# Patient Record
Sex: Male | Born: 1955
Health system: Southern US, Community
[De-identification: ages and names within clinical notes are randomized; demographics above are authoritative.]

## PROBLEM LIST (undated history)

## (undated) ENCOUNTER — Emergency Department (HOSPITAL_COMMUNITY): Admission: EM | Payer: Medicare Other | Source: Home / Self Care

## (undated) DIAGNOSIS — E119 Type 2 diabetes mellitus without complications: Secondary | ICD-10-CM

## (undated) DIAGNOSIS — I10 Essential (primary) hypertension: Secondary | ICD-10-CM

## (undated) DIAGNOSIS — W11XXXA Fall on and from ladder, initial encounter: Secondary | ICD-10-CM

## (undated) DIAGNOSIS — K219 Gastro-esophageal reflux disease without esophagitis: Secondary | ICD-10-CM

## (undated) DIAGNOSIS — L309 Dermatitis, unspecified: Secondary | ICD-10-CM

## (undated) DIAGNOSIS — E7800 Pure hypercholesterolemia, unspecified: Secondary | ICD-10-CM

## (undated) DIAGNOSIS — M1990 Unspecified osteoarthritis, unspecified site: Secondary | ICD-10-CM

## (undated) HISTORY — PX: APPENDECTOMY: SHX54

## (undated) HISTORY — DX: Dermatitis, unspecified: L30.9

---

## 2014-06-20 DIAGNOSIS — Z125 Encounter for screening for malignant neoplasm of prostate: Secondary | ICD-10-CM | POA: Diagnosis not present

## 2014-06-28 DIAGNOSIS — N529 Male erectile dysfunction, unspecified: Secondary | ICD-10-CM | POA: Diagnosis not present

## 2015-01-29 DIAGNOSIS — Z9114 Patient's other noncompliance with medication regimen: Secondary | ICD-10-CM | POA: Diagnosis not present

## 2015-01-29 DIAGNOSIS — E7800 Pure hypercholesterolemia, unspecified: Secondary | ICD-10-CM | POA: Diagnosis not present

## 2015-01-29 DIAGNOSIS — R112 Nausea with vomiting, unspecified: Secondary | ICD-10-CM | POA: Diagnosis not present

## 2015-01-29 DIAGNOSIS — R531 Weakness: Secondary | ICD-10-CM | POA: Diagnosis not present

## 2015-01-29 DIAGNOSIS — E119 Type 2 diabetes mellitus without complications: Secondary | ICD-10-CM | POA: Diagnosis not present

## 2015-01-29 DIAGNOSIS — K219 Gastro-esophageal reflux disease without esophagitis: Secondary | ICD-10-CM | POA: Diagnosis not present

## 2015-01-29 DIAGNOSIS — F17200 Nicotine dependence, unspecified, uncomplicated: Secondary | ICD-10-CM | POA: Diagnosis not present

## 2015-01-29 DIAGNOSIS — R748 Abnormal levels of other serum enzymes: Secondary | ICD-10-CM | POA: Diagnosis not present

## 2015-01-29 DIAGNOSIS — I10 Essential (primary) hypertension: Secondary | ICD-10-CM | POA: Diagnosis not present

## 2015-01-29 DIAGNOSIS — R42 Dizziness and giddiness: Secondary | ICD-10-CM | POA: Diagnosis not present

## 2015-02-17 DIAGNOSIS — F418 Other specified anxiety disorders: Secondary | ICD-10-CM | POA: Diagnosis not present

## 2015-02-28 DIAGNOSIS — F418 Other specified anxiety disorders: Secondary | ICD-10-CM | POA: Diagnosis not present

## 2015-03-17 DIAGNOSIS — F418 Other specified anxiety disorders: Secondary | ICD-10-CM | POA: Diagnosis not present

## 2015-04-01 DIAGNOSIS — R093 Abnormal sputum: Secondary | ICD-10-CM | POA: Diagnosis not present

## 2015-04-01 DIAGNOSIS — J111 Influenza due to unidentified influenza virus with other respiratory manifestations: Secondary | ICD-10-CM | POA: Diagnosis not present

## 2015-04-01 DIAGNOSIS — I10 Essential (primary) hypertension: Secondary | ICD-10-CM | POA: Diagnosis not present

## 2015-04-01 DIAGNOSIS — R0981 Nasal congestion: Secondary | ICD-10-CM | POA: Diagnosis not present

## 2015-04-01 DIAGNOSIS — F17200 Nicotine dependence, unspecified, uncomplicated: Secondary | ICD-10-CM | POA: Diagnosis not present

## 2015-04-01 DIAGNOSIS — R197 Diarrhea, unspecified: Secondary | ICD-10-CM | POA: Diagnosis not present

## 2015-04-01 DIAGNOSIS — E7800 Pure hypercholesterolemia, unspecified: Secondary | ICD-10-CM | POA: Diagnosis not present

## 2015-04-01 DIAGNOSIS — R05 Cough: Secondary | ICD-10-CM | POA: Diagnosis not present

## 2015-04-01 DIAGNOSIS — E119 Type 2 diabetes mellitus without complications: Secondary | ICD-10-CM | POA: Diagnosis not present

## 2015-04-14 DIAGNOSIS — F418 Other specified anxiety disorders: Secondary | ICD-10-CM | POA: Diagnosis not present

## 2015-08-26 DIAGNOSIS — F418 Other specified anxiety disorders: Secondary | ICD-10-CM | POA: Diagnosis not present

## 2015-10-16 DIAGNOSIS — F418 Other specified anxiety disorders: Secondary | ICD-10-CM | POA: Diagnosis not present

## 2015-11-05 DIAGNOSIS — F418 Other specified anxiety disorders: Secondary | ICD-10-CM | POA: Diagnosis not present

## 2016-05-10 ENCOUNTER — Emergency Department (HOSPITAL_BASED_OUTPATIENT_CLINIC_OR_DEPARTMENT_OTHER)
Admission: EM | Admit: 2016-05-10 | Discharge: 2016-05-10 | Disposition: A | Discharge status: Home / Self Care | Payer: Medicare Other | Attending: Emergency Medicine | Admitting: Emergency Medicine

## 2016-05-10 ENCOUNTER — Encounter (HOSPITAL_BASED_OUTPATIENT_CLINIC_OR_DEPARTMENT_OTHER): Payer: Self-pay | Admitting: Emergency Medicine

## 2016-05-10 DIAGNOSIS — E119 Type 2 diabetes mellitus without complications: Secondary | ICD-10-CM | POA: Diagnosis not present

## 2016-05-10 DIAGNOSIS — F17200 Nicotine dependence, unspecified, uncomplicated: Secondary | ICD-10-CM | POA: Insufficient documentation

## 2016-05-10 DIAGNOSIS — Z7984 Long term (current) use of oral hypoglycemic drugs: Secondary | ICD-10-CM | POA: Diagnosis not present

## 2016-05-10 DIAGNOSIS — R21 Rash and other nonspecific skin eruption: Secondary | ICD-10-CM | POA: Diagnosis not present

## 2016-05-10 HISTORY — DX: Type 2 diabetes mellitus without complications: E11.9

## 2016-05-10 MED ORDER — CEPHALEXIN 500 MG PO CAPS: 500.0000 mg | ORAL_CAPSULE | Freq: Two times a day (BID) | ORAL | 0 refills | Status: AC

## 2016-05-10 NOTE — ED Triage Notes (Signed)
Pt states he ws in his yard last night and felt something pinch the back of his right ear.  Pt states he thought it was a mosquito.  Pt woke up this am, he noticed small swollen area behind his ear.  Pt states he squeezed it and black substance came out.  Denies fever.

## 2016-05-10 NOTE — Discharge Instructions (Signed)
We do not know specifically what is causing your rash, but we are treating this with antibiotics.  Please take any prescribed medications as written and continue taking your normal prescription medications unless otherwise specified.    Follow up with the recommended physicians and return to the emergency department with any new or worsening symptoms that concern you, including but not limited to fever, lesions inside your mouth, etc.  Discuss the rash with your PCP to decide if any additional testing is needed after abx.

## 2016-05-10 NOTE — ED Provider Notes (Signed)
Emergency Department Provider Note  By signing my name below, I, Rosana Fretana Waskiewicz, attest that this documentation has been prepared under the direction and in the presence of Long, Arlyss RepressJoshua G, MD. Electronically Signed: Rosana Fretana Waskiewicz, ED Scribe. 05/10/16. 6:02 PM.  I have reviewed the triage vital signs and the nursing notes.   HISTORY  Chief Complaint Abscess   HPI HPI Comments: Levi Simpson is a 61 y.o. male with a PMHx of DM, who presents to the Emergency Department complaining of a moderate, gradually worsening bump behind right ear onset last night. Pt suspects an insect bite to be the cause of the bump. No known treatments were tried prior to arrival in the ED. The pt reports associated swelling to the area, and right-sided facial tingling.  He is currently taking Metformin and has no complaints DM at this time. Pt denies fever, drainage from the area or other associated symptoms.   Past Medical History:  Diagnosis Date  . Diabetes mellitus without complication (HCC)     There are no active problems to display for this patient.   No past surgical history on file.  Current Outpatient Rx  . Order #: [id number] Class: Historical Med  . Order #: [id number] Class: Print    Allergies Patient has no known allergies.  No family history on file.  Social History Social History  Substance Use Topics  . Smoking status: Current Every Day Smoker  . Smokeless tobacco: Never Used  . Alcohol use Not on file    Review of Systems Constitutional: No fever/chills Eyes: No visual changes. ENT: No sore throat. Cardiovascular: Denies chest pain. Respiratory: Denies shortness of breath. Gastrointestinal: No abdominal pain.  No nausea, no vomiting.  No diarrhea.  No constipation. Genitourinary: Negative for dysuria. Musculoskeletal: Negative for back pain. Skin: negative for rash +bump Neurological: Negative for headaches, focal weakness +right-sided facial tingling  10-point ROS  otherwise negative.  ____________________________________________   PHYSICAL EXAM:  VITAL SIGNS: ED Triage Vitals  Enc Vitals Group     BP 05/10/16 1715 (!) 139/92     Pulse Rate 05/10/16 1715 76     Resp 05/10/16 1715 18     Temp 05/10/16 1715 98.3 F (36.8 C)     Temp Source 05/10/16 1715 Oral     SpO2 05/10/16 1715 100 %     Weight 05/10/16 1709 172 lb (78 kg)     Height 05/10/16 1709 5\' 8"  (1.727 m)     Pain Score 05/10/16 1710 8   Constitutional: Alert and oriented. Well appearing and in no acute distress. Eyes: Conjunctivae are normal.  Head: Atraumatic. Nose: No congestion/rhinnorhea. Mouth/Throat: Mucous membranes are moist.   Neck: No stridor.  Cardiovascular: Normal rate, regular rhythm. Good peripheral circulation. Grossly normal heart sounds.   Respiratory: Normal respiratory effort.  No retractions. Lungs CTAB. Gastrointestinal: Soft and nontender. No distention.  Musculoskeletal: No lower extremity tenderness nor edema. No gross deformities of extremities. Neurologic:  Normal speech and language. No gross focal neurologic deficits are appreciated.  Skin:  Skin is warm and dry. Oozing, slightly erythematous rash behind right ear. No fluctuance or masses.    ____________________________________________   PROCEDURES  Procedure(s) performed:   Procedures  None ____________________________________________   INITIAL IMPRESSION / ASSESSMENT AND PLAN / ED COURSE  Pertinent labs & imaging results that were available during my care of the patient were reviewed by me and considered in my medical decision making (see chart for details).  Patient presents to the ED for  evaluation of rash behind the right ear. Consistent with mild cellulitis. Will cover with Keflex. No abscess for I&D. Advised to f/u with PCP after abx to ensure area is gone. May need Dermatology follow up if lesion continues for biopsy. Discussed this plan and impression with the patient in  detail.    ____________________________________________  FINAL CLINICAL IMPRESSION(S) / ED DIAGNOSES  Final diagnoses:  Rash     MEDICATIONS GIVEN DURING THIS VISIT:  Medications - No data to display   NEW OUTPATIENT MEDICATIONS STARTED DURING THIS VISIT:  Discharge Medication List as of 05/10/2016  6:06 PM    START taking these medications   Details  cephALEXin (KEFLEX) 500 MG capsule Take 1 capsule (500 mg total) by mouth 2 (two) times daily., Starting Mon 05/10/2016, Until Mon 05/17/2016, Print        Note:  This document was prepared using Dragon voice recognition software and may include unintentional dictation errors.  Alona Bene, MD Emergency Medicine  I personally performed the services described in this documentation, which was scribed in my presence. The recorded information has been reviewed and is accurate.        Maia Plan, MD 05/11/16 1140

## 2016-06-07 ENCOUNTER — Encounter (HOSPITAL_COMMUNITY): Payer: Self-pay | Admitting: Emergency Medicine

## 2016-06-07 ENCOUNTER — Inpatient Hospital Stay (HOSPITAL_COMMUNITY)
Admission: EM | Admit: 2016-06-07 | Discharge: 2016-06-09 | DRG: 183 | Disposition: A | Discharge status: Home / Self Care | Payer: Medicare Other | Attending: General Surgery | Admitting: General Surgery

## 2016-06-07 ENCOUNTER — Emergency Department (HOSPITAL_COMMUNITY): Payer: Medicare Other

## 2016-06-07 DIAGNOSIS — M170 Bilateral primary osteoarthritis of knee: Secondary | ICD-10-CM | POA: Diagnosis present

## 2016-06-07 DIAGNOSIS — S22089A Unspecified fracture of T11-T12 vertebra, initial encounter for closed fracture: Secondary | ICD-10-CM | POA: Diagnosis present

## 2016-06-07 DIAGNOSIS — M19042 Primary osteoarthritis, left hand: Secondary | ICD-10-CM | POA: Diagnosis present

## 2016-06-07 DIAGNOSIS — E119 Type 2 diabetes mellitus without complications: Secondary | ICD-10-CM | POA: Diagnosis present

## 2016-06-07 DIAGNOSIS — S32039A Unspecified fracture of third lumbar vertebra, initial encounter for closed fracture: Secondary | ICD-10-CM | POA: Diagnosis present

## 2016-06-07 DIAGNOSIS — S301XXA Contusion of abdominal wall, initial encounter: Secondary | ICD-10-CM | POA: Diagnosis present

## 2016-06-07 DIAGNOSIS — S32029A Unspecified fracture of second lumbar vertebra, initial encounter for closed fracture: Secondary | ICD-10-CM | POA: Diagnosis present

## 2016-06-07 DIAGNOSIS — I10 Essential (primary) hypertension: Secondary | ICD-10-CM | POA: Diagnosis present

## 2016-06-07 DIAGNOSIS — S3993XA Unspecified injury of pelvis, initial encounter: Secondary | ICD-10-CM | POA: Diagnosis not present

## 2016-06-07 DIAGNOSIS — W11XXXA Fall on and from ladder, initial encounter: Secondary | ICD-10-CM | POA: Diagnosis not present

## 2016-06-07 DIAGNOSIS — G4489 Other headache syndrome: Secondary | ICD-10-CM | POA: Diagnosis not present

## 2016-06-07 DIAGNOSIS — E7800 Pure hypercholesterolemia, unspecified: Secondary | ICD-10-CM | POA: Diagnosis present

## 2016-06-07 DIAGNOSIS — Z23 Encounter for immunization: Secondary | ICD-10-CM | POA: Diagnosis not present

## 2016-06-07 DIAGNOSIS — S0990XA Unspecified injury of head, initial encounter: Secondary | ICD-10-CM | POA: Diagnosis not present

## 2016-06-07 DIAGNOSIS — S79912A Unspecified injury of left hip, initial encounter: Secondary | ICD-10-CM | POA: Diagnosis not present

## 2016-06-07 DIAGNOSIS — K219 Gastro-esophageal reflux disease without esophagitis: Secondary | ICD-10-CM | POA: Diagnosis present

## 2016-06-07 DIAGNOSIS — S22008A Other fracture of unspecified thoracic vertebra, initial encounter for closed fracture: Secondary | ICD-10-CM

## 2016-06-07 DIAGNOSIS — Z9114 Patient's other noncompliance with medication regimen: Secondary | ICD-10-CM

## 2016-06-07 DIAGNOSIS — F17210 Nicotine dependence, cigarettes, uncomplicated: Secondary | ICD-10-CM | POA: Diagnosis present

## 2016-06-07 DIAGNOSIS — Z7984 Long term (current) use of oral hypoglycemic drugs: Secondary | ICD-10-CM | POA: Diagnosis not present

## 2016-06-07 DIAGNOSIS — S8992XA Unspecified injury of left lower leg, initial encounter: Secondary | ICD-10-CM | POA: Diagnosis not present

## 2016-06-07 DIAGNOSIS — M19041 Primary osteoarthritis, right hand: Secondary | ICD-10-CM | POA: Diagnosis present

## 2016-06-07 DIAGNOSIS — S2242XA Multiple fractures of ribs, left side, initial encounter for closed fracture: Principal | ICD-10-CM | POA: Diagnosis present

## 2016-06-07 DIAGNOSIS — R109 Unspecified abdominal pain: Secondary | ICD-10-CM | POA: Diagnosis not present

## 2016-06-07 DIAGNOSIS — S32049A Unspecified fracture of fourth lumbar vertebra, initial encounter for closed fracture: Secondary | ICD-10-CM | POA: Diagnosis present

## 2016-06-07 DIAGNOSIS — S3991XA Unspecified injury of abdomen, initial encounter: Secondary | ICD-10-CM | POA: Diagnosis not present

## 2016-06-07 DIAGNOSIS — S299XXA Unspecified injury of thorax, initial encounter: Secondary | ICD-10-CM | POA: Diagnosis not present

## 2016-06-07 DIAGNOSIS — S32019A Unspecified fracture of first lumbar vertebra, initial encounter for closed fracture: Secondary | ICD-10-CM | POA: Diagnosis present

## 2016-06-07 DIAGNOSIS — K661 Hemoperitoneum: Secondary | ICD-10-CM | POA: Diagnosis present

## 2016-06-07 DIAGNOSIS — J9811 Atelectasis: Secondary | ICD-10-CM | POA: Diagnosis not present

## 2016-06-07 DIAGNOSIS — M1990 Unspecified osteoarthritis, unspecified site: Secondary | ICD-10-CM | POA: Diagnosis present

## 2016-06-07 DIAGNOSIS — M542 Cervicalgia: Secondary | ICD-10-CM | POA: Diagnosis not present

## 2016-06-07 HISTORY — DX: Fall on and from ladder, initial encounter: W11.XXXA

## 2016-06-07 HISTORY — DX: Pure hypercholesterolemia, unspecified: E78.00

## 2016-06-07 HISTORY — DX: Gastro-esophageal reflux disease without esophagitis: K21.9

## 2016-06-07 HISTORY — DX: Essential (primary) hypertension: I10

## 2016-06-07 HISTORY — DX: Type 2 diabetes mellitus without complications: E11.9

## 2016-06-07 HISTORY — DX: Unspecified osteoarthritis, unspecified site: M19.90

## 2016-06-07 LAB — COMPREHENSIVE METABOLIC PANEL
ALBUMIN: 3.9 g/dL (ref 3.5–5.0)
ALK PHOS: 58 U/L (ref 38–126)
ALT: 24 U/L (ref 17–63)
AST: 33 U/L (ref 15–41)
Anion gap: 9 (ref 5–15)
BUN: 11 mg/dL (ref 6–20)
CALCIUM: 9 mg/dL (ref 8.9–10.3)
CO2: 23 mmol/L (ref 22–32)
Chloride: 105 mmol/L (ref 101–111)
Creatinine, Ser: 0.89 mg/dL (ref 0.61–1.24)
GFR calc Af Amer: >60 mL/min (ref >60)
GFR calc non Af Amer: >60 mL/min (ref >60)
GLUCOSE: 101 mg/dL — AB (ref 65–99)
Potassium: 3.9 mmol/L (ref 3.5–5.1)
Sodium: 137 mmol/L (ref 135–145)
TOTAL PROTEIN: 6.4 g/dL — AB (ref 6.5–8.1)
Total Bilirubin: 0.8 mg/dL (ref 0.3–1.2)

## 2016-06-07 LAB — CBC
HCT: 40.9 % (ref 39.0–52.0)
HEMOGLOBIN: 13.5 g/dL (ref 13.0–17.0)
MCH: 28.2 pg (ref 26.0–34.0)
MCHC: 33 g/dL (ref 30.0–36.0)
MCV: 85.6 fL (ref 78.0–100.0)
Platelets: 187 10*3/uL (ref 150–400)
RBC: 4.78 MIL/uL (ref 4.22–5.81)
RDW: 13.1 % (ref 11.5–15.5)
WBC: 6.3 10*3/uL (ref 4.0–10.5)

## 2016-06-07 LAB — GLUCOSE, CAPILLARY
GLUCOSE-CAPILLARY: 89 mg/dL (ref 65–99)
Glucose-Capillary: 123 mg/dL — ABNORMAL HIGH (ref 65–99)

## 2016-06-07 LAB — I-STAT CHEM 8, ED
BUN: 11 mg/dL (ref 6–20)
CHLORIDE: 103 mmol/L (ref 101–111)
Calcium, Ion: 1.19 mmol/L (ref 1.15–1.40)
Creatinine, Ser: 0.9 mg/dL (ref 0.61–1.24)
Glucose, Bld: 104 mg/dL — ABNORMAL HIGH (ref 65–99)
HEMATOCRIT: 42 % (ref 39.0–52.0)
Hemoglobin: 14.3 g/dL (ref 13.0–17.0)
POTASSIUM: 3.9 mmol/L (ref 3.5–5.1)
SODIUM: 139 mmol/L (ref 135–145)
TCO2: 24 mmol/L (ref 0–100)

## 2016-06-07 LAB — URINALYSIS, ROUTINE W REFLEX MICROSCOPIC
BILIRUBIN URINE: NEGATIVE
Glucose, UA: NEGATIVE mg/dL
HGB URINE DIPSTICK: NEGATIVE
Ketones, ur: 5 mg/dL — AB
Leukocytes, UA: NEGATIVE
Nitrite: NEGATIVE
PROTEIN: NEGATIVE mg/dL
pH: 5 (ref 5.0–8.0)

## 2016-06-07 LAB — SAMPLE TO BLOOD BANK

## 2016-06-07 LAB — CDS SEROLOGY

## 2016-06-07 LAB — PROTIME-INR
INR: 0.99
PROTHROMBIN TIME: 13.1 s (ref 11.4–15.2)

## 2016-06-07 LAB — ETHANOL: ALCOHOL ETHYL (B): 6 mg/dL — AB (ref <5)

## 2016-06-07 MED ORDER — HYDROMORPHONE HCL 1 MG/ML IJ SOLN
INTRAMUSCULAR | Status: AC
  Administered 2016-06-07: 1 mg via INTRAVENOUS
  Filled 2016-06-07: qty 1

## 2016-06-07 MED ORDER — HYDRALAZINE HCL 20 MG/ML IJ SOLN: 10.0000 mg | Freq: Four times a day (QID) | INTRAMUSCULAR | Status: DC | PRN

## 2016-06-07 MED ORDER — DOCUSATE SODIUM 100 MG PO CAPS
100.0000 mg | ORAL_CAPSULE | Freq: Two times a day (BID) | ORAL | Status: DC
  Administered 2016-06-07 – 2016-06-09 (×3): 100 mg via ORAL
  Filled 2016-06-07 (×3): qty 1

## 2016-06-07 MED ORDER — ENOXAPARIN SODIUM 40 MG/0.4ML ~~LOC~~ SOLN
40.0000 mg | SUBCUTANEOUS | Status: DC
  Administered 2016-06-08 – 2016-06-09 (×2): 40 mg via SUBCUTANEOUS
  Filled 2016-06-07 (×2): qty 0.4

## 2016-06-07 MED ORDER — ONDANSETRON HCL 4 MG PO TABS: 4.0000 mg | ORAL_TABLET | Freq: Four times a day (QID) | ORAL | Status: DC | PRN

## 2016-06-07 MED ORDER — HYDROMORPHONE HCL 1 MG/ML IJ SOLN: 1.0000 mg | INTRAMUSCULAR | Status: DC | PRN

## 2016-06-07 MED ORDER — ONDANSETRON HCL 4 MG/2ML IJ SOLN: 4.0000 mg | Freq: Four times a day (QID) | INTRAMUSCULAR | Status: DC | PRN

## 2016-06-07 MED ORDER — ACETAMINOPHEN 325 MG PO TABS: 650.0000 mg | ORAL_TABLET | ORAL | Status: DC | PRN

## 2016-06-07 MED ORDER — OXYCODONE HCL 5 MG PO TABS
5.0000 mg | ORAL_TABLET | ORAL | Status: DC | PRN
  Administered 2016-06-07: 5 mg via ORAL
  Administered 2016-06-08 – 2016-06-09 (×5): 15 mg via ORAL
  Filled 2016-06-07 (×3): qty 3
  Filled 2016-06-07: qty 1
  Filled 2016-06-07 (×2): qty 3

## 2016-06-07 MED ORDER — SODIUM CHLORIDE 0.9 % IV SOLN: 250.0000 mL | INTRAVENOUS | Status: DC | PRN

## 2016-06-07 MED ORDER — INSULIN ASPART 100 UNIT/ML ~~LOC~~ SOLN: 0.0000 [IU] | Freq: Every day | SUBCUTANEOUS | Status: DC

## 2016-06-07 MED ORDER — PNEUMOCOCCAL VAC POLYVALENT 25 MCG/0.5ML IJ INJ
0.5000 mL | INJECTION | INTRAMUSCULAR | Status: AC
  Administered 2016-06-09: 0.5 mL via INTRAMUSCULAR
  Filled 2016-06-07: qty 0.5

## 2016-06-07 MED ORDER — IOPAMIDOL (ISOVUE-300) INJECTION 61%
INTRAVENOUS | Status: AC
  Administered 2016-06-07: 100 mL
  Filled 2016-06-07: qty 100

## 2016-06-07 MED ORDER — SODIUM CHLORIDE 0.9% FLUSH: 3.0000 mL | INTRAVENOUS | Status: DC | PRN

## 2016-06-07 MED ORDER — HYDROMORPHONE HCL 1 MG/ML IJ SOLN
1.0000 mg | INTRAMUSCULAR | Status: DC | PRN
  Administered 2016-06-07 (×2): 1 mg via INTRAVENOUS
  Filled 2016-06-07: qty 1

## 2016-06-07 MED ORDER — SODIUM CHLORIDE 0.9% FLUSH
3.0000 mL | Freq: Two times a day (BID) | INTRAVENOUS | Status: DC
  Administered 2016-06-07 – 2016-06-08 (×2): 3 mL via INTRAVENOUS

## 2016-06-07 MED ORDER — TETANUS-DIPHTH-ACELL PERTUSSIS 5-2.5-18.5 LF-MCG/0.5 IM SUSP
0.5000 mL | Freq: Once | INTRAMUSCULAR | Status: AC
  Administered 2016-06-07: 0.5 mL via INTRAMUSCULAR
  Filled 2016-06-07: qty 0.5

## 2016-06-07 MED ORDER — METHOCARBAMOL 1000 MG/10ML IJ SOLN
1000.0000 mg | Freq: Three times a day (TID) | INTRAVENOUS | Status: DC | PRN
  Filled 2016-06-07 (×2): qty 10

## 2016-06-07 MED ORDER — INSULIN ASPART 100 UNIT/ML ~~LOC~~ SOLN
0.0000 [IU] | Freq: Three times a day (TID) | SUBCUTANEOUS | Status: DC
  Administered 2016-06-09: 2 [IU] via SUBCUTANEOUS

## 2016-06-07 NOTE — ED Triage Notes (Signed)
Pt arrives from home via [facility] reporting fall from top of 24 ft ladder in tree.  Pt reports LOC, denies CP or SOB. Pt reports ambulating s/p fall, reports generalized L sided pain. Lacs noted to LLE, LUE, bleeding controlled at this time. Pt AOX4.

## 2016-06-07 NOTE — ED Notes (Signed)
Patient is stable and ready to be transport to the floor at this time.  Report was called to [facility] Charlie RN.  Belongings taken with the patient to the floor.
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
## 2016-06-07 NOTE — Progress Notes (Signed)
Orthopedic Tech Progress Note Patient Details:  Levi Simpson 12/30/1955 [id number]  Patient ID: Levi ReeJimmy Simpson, male   DOB: 12/30/1955, 61 y.o.   MRN: [id number]   Levi Simpson, Levi Simpson 06/07/2016, 10:56 AM Made level 2 trauma visit

## 2016-06-07 NOTE — Progress Notes (Signed)
Orthopedic Tech Progress Note Patient Details:  Levi ReeJimmy Vallecillo 11-13-1955 [id number]  Patient ID: Levi Simpson, male   DOB: 11-13-1955, 61 y.o.   MRN: [id number]   Levi Simpson, Levi Simpson 06/07/2016, 10:57 AM Delete one ortho visit
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
## 2016-06-08 ENCOUNTER — Inpatient Hospital Stay (HOSPITAL_COMMUNITY): Payer: Medicare Other

## 2016-06-08 LAB — CBC
HCT: 41.6 % (ref 39.0–52.0)
HEMOGLOBIN: 13.6 g/dL (ref 13.0–17.0)
MCH: 28.1 pg (ref 26.0–34.0)
MCHC: 32.7 g/dL (ref 30.0–36.0)
MCV: 86 fL (ref 78.0–100.0)
Platelets: 206 10*3/uL (ref 150–400)
RBC: 4.84 MIL/uL (ref 4.22–5.81)
RDW: 13.4 % (ref 11.5–15.5)
WBC: 7.8 10*3/uL (ref 4.0–10.5)

## 2016-06-08 LAB — BASIC METABOLIC PANEL
ANION GAP: 8 (ref 5–15)
BUN: 7 mg/dL (ref 6–20)
CHLORIDE: 102 mmol/L (ref 101–111)
CO2: 27 mmol/L (ref 22–32)
Calcium: 9.1 mg/dL (ref 8.9–10.3)
Creatinine, Ser: 0.93 mg/dL (ref 0.61–1.24)
GFR calc non Af Amer: >60 mL/min (ref >60)
Glucose, Bld: 107 mg/dL — ABNORMAL HIGH (ref 65–99)
POTASSIUM: 3.8 mmol/L (ref 3.5–5.1)
SODIUM: 137 mmol/L (ref 135–145)

## 2016-06-08 LAB — HIV ANTIBODY (ROUTINE TESTING W REFLEX): HIV SCREEN 4TH GENERATION: NONREACTIVE

## 2016-06-08 LAB — GLUCOSE, CAPILLARY
GLUCOSE-CAPILLARY: 99 mg/dL (ref 65–99)
GLUCOSE-CAPILLARY: 113 mg/dL — AB (ref 65–99)
GLUCOSE-CAPILLARY: 111 mg/dL — AB (ref 65–99)
GLUCOSE-CAPILLARY: 119 mg/dL — AB (ref 65–99)

## 2016-06-08 NOTE — Evaluation (Signed)
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 PMH of HTN and DM.   Clinical Impression   PTA, pt was independent with ADL and functional mobility and was working as a Programme researcher, broadcasting/film/video. Pt currently requires min assist for LB ADL and supervision for tub transfers. Educated pt on use of AE for LB ADL and he reports that he has these at home. Additionally educated pt on compensatory strategies for ADL to reduce pain and increase independence as well as fall prevention strategies. Pt reports that he will have 24 hour assistance from family post-acute D/C. No further acute OT needs identified and pt reports no further questions/concerns. No OT follow-up recommended. OT will sign off.     Follow Up Recommendations  No OT follow up;Supervision/Assistance - 24 hour    Equipment Recommendations  None recommended by OT (Has needs met)    Recommendations for Other Services       Precautions / Restrictions Precautions Precautions: None Restrictions Weight Bearing Restrictions: No      Mobility Bed Mobility Overal bed mobility: Modified Independent             General bed mobility comments: used bedrails  Transfers Overall transfer level: Needs assistance Equipment used: None Transfers: Sit to/from Stand Sit to Stand: Supervision         General transfer comment: Supervision for safety requiring VC's for stand with RW.     Balance Overall balance assessment: No apparent balance deficits (not formally assessed)                                         ADL either performed or assessed with clinical judgement   ADL Overall ADL's : Needs assistance/impaired Eating/Feeding: Set up;Sitting   Grooming: Supervision/safety;Standing   Upper Body Bathing: Set  up;Sitting   Lower Body Bathing: Minimal assistance;Sit to/from stand   Upper Body Dressing : Set up;Sitting   Lower Body Dressing: Minimal assistance;Sit to/from stand   Toilet Transfer: Supervision/safety;Ambulation;Comfort height toilet   Toileting- Clothing Manipulation and Hygiene: Supervision/safety;Sit to/from stand Toileting - Clothing Manipulation Details (indicate cue type and reason): Educated on avoiding twisting to decrease pain.  Tub/ Shower Transfer: Supervision/safety;Ambulation;Tub transfer;Grab bars   Functional mobility during ADLs: Supervision/safety General ADL Comments: Pt educated on use of AE for LB ADL. Unable to cross LE's over opposite knee for dressing tasks. Able to complete tub transfers with supervision this session and good stability. Educated on home set-up to avoid reaching and bending and for fall prevention.      Vision Patient Visual Report: No change from baseline Vision Assessment?: No apparent visual deficits     Perception     Praxis      Pertinent Vitals/Pain Pain Assessment: Faces Pain Score: 3  Faces Pain Scale: Hurts little more Pain Location: L side Pain Descriptors / Indicators: Tightness;Grimacing;Guarding Pain Intervention(s): Monitored during session;Repositioned     Hand Dominance     Extremity/Trunk Assessment Upper Extremity Assessment Upper Extremity Assessment: Overall WFL for tasks assessed   Lower Extremity Assessment Lower Extremity Assessment: Overall WFL for tasks assessed   Cervical / Trunk Assessment Cervical / Trunk Assessment: Normal   Communication Communication Communication:  No difficulties   Cognition Arousal/Alertness: Awake/alert Behavior During Therapy: WFL for tasks assessed/performed Overall Cognitive Status: Within Functional Limits for tasks assessed                                     General Comments       Exercises     Shoulder Instructions      Home Living  Family/patient expects to be discharged to:: Private residence Living Arrangements: Spouse/significant other Available Help at Discharge: Family;Available 24 hours/day Type of Home: House Home Access: Stairs to enter [employer] of Steps: 1 Entrance Stairs-Rails: Right Home Layout: One level     Bathroom Shower/Tub: Tub/shower unit;Curtain   Bathroom Toilet: Standard Bathroom Accessibility: Yes   Home Equipment: Bedside commode;Adaptive equipment Adaptive Equipment: Sock aid        Prior Functioning/Environment Level of Independence: Independent        Comments: Works as a Programme researcher, 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        OT Problem List: Decreased activity tolerance;Impaired balance (sitting and/or standing);Decreased safety awareness;Decreased knowledge of use of DME or AE;Pain      OT Treatment/Interventions:      OT Goals(Current goals can be found in the care plan section) Acute Rehab OT Goals Patient Stated Goal: go home OT Goal Formulation: With patient Time For Goal Achievement: 06/22/16 Potential to Achieve Goals: Good  OT Frequency:     Barriers to D/C:            Co-evaluation              AM-PAC PT "6 Clicks" Daily Activity     Outcome Measure Help from another person eating meals?: None Help from another person taking care of personal grooming?: A Little Help from another person toileting, which includes using toliet, bedpan, or urinal?: A Little Help from another person bathing (including washing, rinsing, drying)?: A Little Help from another person to put on and taking off regular upper body clothing?: None Help from another person to put on and taking off regular lower body clothing?: A Little 6 Click Score: 20   End of Session Equipment Utilized During Treatment: Rolling walker  Activity Tolerance: Patient tolerated treatment well Patient left: in bed;with call bell/phone within reach  OT Visit Diagnosis:  Pain;History of falling (Z91.81) Pain - Right/Left: Left Pain - part of body:  (side)                Time: 8341-9622 OT Time Calculation (min): 9 min Charges:  OT General Charges $OT Visit: 1 Procedure OT Evaluation $OT Eval Moderate Complexity: 1 Procedure G-Codes:     Norman Herrlich, MS OTR/L  Pager: [place] 06/08/2016, 11:13 AM

## 2016-06-08 NOTE — Evaluation (Signed)
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 PMH of HTN and DM.  Clinical Impression  Patient evaluated by Physical Therapy with no further acute PT needs identified. All education has been completed and the patient has no further questions. Pt is mod I for bed mobility, transfers, ambulation of 550 feet and ascent/descent of 5 stairs. See below for any follow-up Physical Therapy or equipment needs. PT is signing off. Thank you for this referral.     Follow Up Recommendations No PT follow up    Equipment Recommendations  None recommended by PT    Recommendations for Other Services       Precautions / Restrictions Precautions Precautions: None Restrictions Weight Bearing Restrictions: No      Mobility  Bed Mobility Overal bed mobility: Modified Independent             General bed mobility comments: used bedrails  Transfers Overall transfer level: Modified independent Equipment used: None             General transfer comment: pt came completely to upright before reach to RW  Ambulation/Gait Ambulation/Gait assistance: Modified independent (Device/Increase time) Ambulation Distance (Feet): 500 Feet (1x350 with RW, 1x200 without AD) Assistive device: Rolling walker (2 wheeled);None Gait Pattern/deviations: WFL(Within Functional Limits)   Gait velocity interpretation: at or above normal speed for age/gender General Gait Details: slow, steady cadence, no LoB, no buckling able to ambulate with and without RW  Stairs Stairs: Yes Stairs assistance: Modified independent (Device/Increase time) Stair Management: One rail Left Number of Stairs: 5 General stair comments: no difficulty with ascent or descent of stairs      Balance Overall balance  assessment: No apparent balance deficits (not formally assessed)                                           Pertinent Vitals/Pain Pain Assessment: 0-10 Pain Score: 3  Pain Location: L side Pain Descriptors / Indicators: Tightness;Grimacing;Guarding Pain Intervention(s): Limited activity within patient's tolerance;Monitored during session;Repositioned  VSS    Home Living Family/patient expects to be discharged to:: Private residence Living Arrangements: Spouse/significant other Available Help at Discharge: Family;Available 24 hours/day Type of Home: House Home Access: Stairs to enter Entrance Stairs-Rails: Right Entrance Stairs-Number of Steps: 1 Home Layout: One level Home Equipment: Bedside commode      Prior Function Level of Independence: Independent         Comments: tree climber and driver      Hand Dominance        Extremity/Trunk Assessment   Upper Extremity Assessment Upper Extremity Assessment: Defer to OT evaluation    Lower Extremity Assessment Lower Extremity Assessment: Overall WFL for tasks assessed    Cervical / Trunk Assessment Cervical / Trunk Assessment: Normal  Communication   Communication: No difficulties  Cognition Arousal/Alertness: Awake/alert Behavior During Therapy: WFL for tasks assessed/performed Overall Cognitive Status: Within Functional Limits for tasks assessed  Assessment/Plan    PT Assessment Patent does not need any further PT services         PT Goals (Current goals can be found in the Care Plan section)  Acute Rehab PT Goals Patient Stated Goal: go home     AM-PAC PT "6 Clicks" Daily Activity  Outcome Measure Difficulty turning over in bed (including adjusting bedclothes, sheets and blankets)?: None Difficulty moving from lying on back to sitting on the side of the bed? : A Little Difficulty sitting down on and standing up from  a chair with arms (e.g., wheelchair, bedside commode, etc,.)?: None Help needed moving to and from a bed to chair (including a wheelchair)?: None Help needed walking in hospital room?: None Help needed climbing 3-5 steps with a railing? : None 6 Click Score: 23    End of Session Equipment Utilized During Treatment: Gait belt Activity Tolerance: Patient tolerated treatment well Patient left: in chair;with call bell/phone within reach Nurse Communication: Mobility status      Time: 2952-84130925-0940 PT Time Calculation (min) (ACUTE ONLY): 15 min   Charges:   PT Evaluation $PT Eval Low Complexity: 1 Procedure     PT G Codes:        Noor Witte B. Beverely RisenVan Fleet PT, DPT Acute Rehabilitation  [phone] Pager [phone]    Elon Alaslizabeth B Van Fleet 06/08/2016, 10:31 AM

## 2016-06-08 NOTE — Progress Notes (Signed)
  Subjective: CC good pain control, about to get up with therapies, tolerated breakfast well  Objective: Vital signs in last 24 hours: Temp:  [98 F (36.7 C)-99.1 F (37.3 C)] 98.8 F (37.1 C) (06/05 0438) Pulse Rate:  [70-84] 73 (06/05 0438) Resp:  [16-18] 16 (06/05 0438) BP: (111-142)/(70-89) 123/79 (06/05 0438) SpO2:  [93 %-98 %] 97 % (06/05 0438) Weight:  [78 kg (172 lb)] 78 kg (172 lb) (06/04 1056) Last BM Date: 06/07/16  Intake/Output from previous day: 06/04 0701 - 06/05 0700 In: 630 [P.O.:630] Out: 1725 [Urine:1725] Intake/Output this shift: No intake/output data recorded.  General appearance: alert and cooperative Resp: clear to auscultation bilaterally Cardio: regular rate and rhythm GI: soft, NT, ND Extremities: calves soft, abrasions L shin and knee  Neuro: A&O, MAE with good str  Lab Results: CBC   Recent Labs  06/07/16 1124 06/07/16 1149 06/08/16 0415  WBC 6.3  --  7.8  HGB 13.5 14.3 13.6  HCT 40.9 42.0 41.6  PLT 187  --  206   BMET  Recent Labs  06/07/16 1124 06/07/16 1149 06/08/16 0415  NA 137 139 137  K 3.9 3.9 3.8  CL 105 103 102  CO2 23  --  27  GLUCOSE 101* 104* 107*  BUN 11 11 7   CREATININE 0.89 0.90 0.93  CALCIUM 9.0  --  9.1   PT/INR  Recent Labs  06/07/16 1124  LABPROT 13.1  INR 0.99    Assessment/Plan: Fall from ladder  L rib FX 7-12 - pulm toliet, CXR no PTX T11, L1-4 TVP FX with L psoas hematoma - pain control, robaxin DM - SSI, glucose has been 101-107 HTN - has a history but BP ok here FEN - carb mod diet DIspo - PT/OT evals today   LOS: 1 day    Violeta GelinasBurke Ciera Beckum, MD, MPH, FACS Trauma: [phone] General Surgery: [phone]  6/5/2018Patient ID: Deniece ReeJimmy Heuer, male   DOB: [birth year], 61 y.o.   MRN: [id number]

## 2016-06-08 NOTE — Plan of Care (Signed)
Problem: Safety: Goal: Ability to remain free from injury will improve Outcome: Progressing No incidence of falls during this admission. Call bell within reach. Bed in low and locked position. Clean and clear environment maintained. Patient alert and oriented. Nonskid footwear being utilized. 3/4 siderails in place. Patient verbalized understanding of safety instruction.  Problem: Coping: Goal: Ability to adjust to condition or change in health will improve Outcome: Progressing Patient understands diabetes and how to properly check his blood sugar and treat the results.

## 2016-06-08 NOTE — Care Management Note (Signed)
Case Management Note  Patient Details  Name: Deniece ReeJimmy Ansley MRN: [id number] Date of Birth: [birth year]  Subjective/Objective:                    Action/Plan:  Can provide MATCH letter on day of discharge  Expected Discharge Date:                  Expected Discharge Plan:  Home/Self Care  In-House Referral:     Discharge planning Services     Post Acute Care Choice:    Choice offered to:     DME Arranged:    DME Agency:     HH Arranged:    HH Agency:     Status of Service:  In process, will continue to follow  If discussed at Long Length of Stay Meetings, dates discussed:    Additional Comments:  Kingsley PlanWile, Milagro Belmares Marie, RN 06/08/2016, 11:00 AM

## 2016-06-09 ENCOUNTER — Encounter (HOSPITAL_BASED_OUTPATIENT_CLINIC_OR_DEPARTMENT_OTHER): Payer: Self-pay | Admitting: Emergency Medicine

## 2016-06-09 LAB — GLUCOSE, CAPILLARY: Glucose-Capillary: 121 mg/dL — ABNORMAL HIGH (ref 65–99)

## 2016-06-09 MED ORDER — CYCLOBENZAPRINE HCL 10 MG PO TABS: 10.0000 mg | ORAL_TABLET | Freq: Three times a day (TID) | ORAL | 0 refills | Status: DC | PRN

## 2016-06-09 MED ORDER — OXYCODONE HCL 5 MG PO TABS: 5.0000 mg | ORAL_TABLET | ORAL | 0 refills | Status: DC | PRN
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
## 2016-06-09 NOTE — Progress Notes (Signed)
Discharge instructions reviewed with pt and prescriptions given.  Pt verbalized understanding and had no questions.  Pt discharged in stable condition via wheelchair.  Deliyah Muckle Lindsay    

## 2016-06-09 NOTE — Discharge Instructions (Signed)

## 2016-06-09 NOTE — Discharge Summary (Signed)
Physician Discharge Summary  Patient ID: Levi ReeJimmy Mcclenny MRN: [id number] DOB/AGE: October 22, 1955 61 y.o.  Admit date: 06/07/2016 Discharge date: 06/09/2016  Discharge Diagnoses Fall from ladder Multiple left sided rib fractures T11 and L1-4 transverse process fractures Left psoas hematoma  Consultants None  Procedures None  HPI: Pt is a 61 year old malewith a history of diabetes and hypertension uncontrolled who presented to the emergency department after a fall from 20 feet. Patient stated he was on a ladder when a branch struck him causing him to fall 20 feet onto his left side. Patient is complained of pain on his left side that was non-radiating, 3/10, worse with lifting left leg, better with bending over. No other associated symptoms noted. Patient stated he had a brief loss of consciousness after the fall. Patient stated he did strike his head. He was having a mild, 3/10 headache. No visual changes, no numbness/tingling, no weakness, nausea, vomiting, or abdominal pain. Patient was not amnestic to the event.   Hospital Course: Patient was admitted to the trauma service for pain control and observation of pulmonary status. Patient worked with PT/OT who recommended no follow up but 24 hour supervision at discharge. Patient's pain is well controlled, he is tolerating a PO diet, voiding appropriately and ambulating without assistance. At this time he is in good condition for discharge home. He will follow up in the trauma clinic in 2 weeks.    Allergies as of 06/09/2016   No Known Allergies     Medication List    TAKE these medications   cyclobenzaprine 10 MG tablet Commonly known as:  FLEXERIL Take 1 tablet (10 mg total) by mouth 3 (three) times daily as needed for muscle spasms.   oxyCODONE 5 MG immediate release tablet Commonly known as:  Oxy IR/ROXICODONE Take 1-3 tablets (5-15 mg total) by mouth every 4 (four) hours as needed (5mg  for mild pain, 10mg  for moderate pain, 15mg  for severe  pain).      I have personally looked this patient up in the [place] Controlled Substance Database and reviewed their medications.  Follow-up Information    [facility]. Go on 06/24/2016.   Why:  Your appointment is at 10:15, please arrive by 9:45 for check-in. Bring photo ID and any insurance information.  Contact information: [address] [place] [id number] [phone]          Signed: Wells GuilesKelly Rayburn , [facility] 06/09/2016, 8:26 AM Pager: [phone] Trauma: [phone] Mon-Fri 7:00 am-4:30 pm Sat-Sun 7:00 am-11:30 am

## 2016-06-24 DIAGNOSIS — S2242XD Multiple fractures of ribs, left side, subsequent encounter for fracture with routine healing: Secondary | ICD-10-CM | POA: Diagnosis not present

## 2016-06-24 DIAGNOSIS — W11XXXA Fall on and from ladder, initial encounter: Secondary | ICD-10-CM | POA: Diagnosis not present

## 2017-01-13 DIAGNOSIS — Z13220 Encounter for screening for lipoid disorders: Secondary | ICD-10-CM | POA: Diagnosis not present

## 2017-01-13 DIAGNOSIS — K219 Gastro-esophageal reflux disease without esophagitis: Secondary | ICD-10-CM | POA: Diagnosis not present

## 2017-01-13 DIAGNOSIS — Z720 Tobacco use: Secondary | ICD-10-CM | POA: Diagnosis not present

## 2017-01-13 DIAGNOSIS — R1310 Dysphagia, unspecified: Secondary | ICD-10-CM | POA: Diagnosis not present

## 2017-01-13 DIAGNOSIS — Z131 Encounter for screening for diabetes mellitus: Secondary | ICD-10-CM | POA: Diagnosis not present

## 2017-01-13 DIAGNOSIS — R499 Unspecified voice and resonance disorder: Secondary | ICD-10-CM | POA: Diagnosis not present

## 2017-06-14 ENCOUNTER — Other Ambulatory Visit: Payer: Self-pay

## 2017-06-14 ENCOUNTER — Encounter (HOSPITAL_BASED_OUTPATIENT_CLINIC_OR_DEPARTMENT_OTHER): Payer: Self-pay | Admitting: Emergency Medicine

## 2017-06-14 ENCOUNTER — Emergency Department (HOSPITAL_BASED_OUTPATIENT_CLINIC_OR_DEPARTMENT_OTHER)
Admission: EM | Admit: 2017-06-14 | Discharge: 2017-06-14 | Disposition: A | Discharge status: Home / Self Care | Payer: 59 | Attending: Emergency Medicine | Admitting: Emergency Medicine

## 2017-06-14 DIAGNOSIS — I10 Essential (primary) hypertension: Secondary | ICD-10-CM | POA: Diagnosis not present

## 2017-06-14 DIAGNOSIS — F17210 Nicotine dependence, cigarettes, uncomplicated: Secondary | ICD-10-CM | POA: Insufficient documentation

## 2017-06-14 DIAGNOSIS — Z7984 Long term (current) use of oral hypoglycemic drugs: Secondary | ICD-10-CM | POA: Insufficient documentation

## 2017-06-14 DIAGNOSIS — R21 Rash and other nonspecific skin eruption: Secondary | ICD-10-CM | POA: Diagnosis present

## 2017-06-14 DIAGNOSIS — E119 Type 2 diabetes mellitus without complications: Secondary | ICD-10-CM | POA: Insufficient documentation

## 2017-06-14 DIAGNOSIS — L259 Unspecified contact dermatitis, unspecified cause: Secondary | ICD-10-CM | POA: Diagnosis not present

## 2017-06-14 MED ORDER — PREDNISONE 50 MG PO TABS: 50.0000 mg | ORAL_TABLET | Freq: Every day | ORAL | 0 refills | Status: AC

## 2017-06-14 MED ORDER — TRIAMCINOLONE ACETONIDE 0.1 % EX CREA: 1.0000 "application " | TOPICAL_CREAM | Freq: Two times a day (BID) | CUTANEOUS | 0 refills | Status: DC

## 2017-06-14 MED ORDER — DIPHENHYDRAMINE HCL 25 MG PO TABS: 25.0000 mg | ORAL_TABLET | Freq: Four times a day (QID) | ORAL | 0 refills | Status: DC

## 2017-06-14 NOTE — ED Notes (Signed)
ED Provider at bedside. 

## 2017-06-14 NOTE — ED Provider Notes (Signed)
[facility] EMERGENCY DEPARTMENT Provider Note   CSN: [id number] Arrival date & time: 06/14/17  1754     History   Chief Complaint Chief Complaint  Patient presents with  . Rash    HPI Deniece ReeJimmy Betzler is a 62 y.o. male who presents to the emergency department today for a diffuse itchy rash that has been present for approximately 3 weeks.  Patient notes that he has been noticing a rash and is raised covering his upper arms as well as around his neck where his shirt lies. He is unsure if he recently switched laundry detergents. He reports that this is a very itchy rash and is raised.  He initially thought that this may be poison ivy so he is tried calamine lotion and hydrocortisone cream at home with only minimal to moderate relief. No vesicles, blisters or weeping reported. He has not taking anything for the itching such as Benadryl. The area is not painful. Denies fever, chills, contacts with persons with similar rash, or any changes in lotions/soaps, exposure to animal or plant irritants. No swelling or purulent discharge. No new medications. No recent travel. No recent tick bites. No involvement to palms/soles or between webspaces. No lip, tongue swelling, difficulty breathing, difficulty swallowing, shortness of breath, chest tightness, abdominal pain, nausea/vomiting/diarrhea.  HPI  Past Medical History:  Diagnosis Date  . Accidental fall from ladder 06/07/2016    fall from top of 24 ft ladder in tree w/left sided rib fractures 7-12, TPF of L1, L2, L3 and L4 vertebral bodies Hattie Perch/notes 06/07/2016  . Arthritis    "knees, hands, back" (06/08/2016)  . Diabetes mellitus without complication (HCC)   . GERD (gastroesophageal reflux disease)   . High cholesterol   . Hypertension   . Type II diabetes mellitus [facility](HCC)     Patient Active Problem List   Diagnosis Date Noted  . Multiple fractures of ribs, left side, initial encounter for closed fracture 06/07/2016    Past Surgical  History:  Procedure Laterality Date  . APPENDECTOMY          Home Medications    Prior to Admission medications   Medication Sig Start Date End Date Taking? Authorizing Provider  metFORMIN (GLUCOPHAGE) 500 MG tablet Take by mouth 2 (two) times daily with a meal.    [provider]    Family History No family history on file.  Social History Social History   Tobacco Use  . Smoking status: Current Every Day Smoker    Packs/day: 0.50    Years: 49.00    Pack years: 24.50    Types: Cigarettes  . Smokeless tobacco: Never Used  Substance Use Topics  . Alcohol use: Yes    Comment: 06/08/2016 "I drink a beer once/month"  . Drug use: No     Allergies   Patient has no known allergies.   Review of Systems Review of Systems  All other systems reviewed and are negative.    Physical Exam Updated Vital Signs BP 119/85   Pulse 96   Temp 98.4 F (36.9 C) (Oral)   Resp 18   SpO2 100%   Physical Exam  Constitutional: He appears well-developed and well-nourished.  HENT:  Head: Normocephalic and atraumatic.  Right Ear: External ear normal.  Left Ear: External ear normal.  Nose: Nose normal.  Mouth/Throat: Uvula is midline, oropharynx is clear and moist and mucous membranes are normal. No tonsillar exudate.  No lip swelling. No tongue swelling. No uvular swelling. No angioedema. Normal  phonation. Patinet tolerating secretions. No lesions of oral mucosa.   Eyes: Pupils are equal, round, and reactive to light. Right eye exhibits no discharge. Left eye exhibits no discharge. No scleral icterus.  Neck: Trachea normal. Neck supple. No spinous process tenderness present. No neck rigidity. Normal range of motion present.  Cardiovascular: Normal rate, regular rhythm and intact distal pulses.  No murmur heard. Pulses:      Radial pulses are 2+ on the right side, and 2+ on the left side.       Dorsalis pedis pulses are 2+ on the right side, and 2+ on the left side.        Posterior tibial pulses are 2+ on the right side, and 2+ on the left side.  No lower extremity swelling or edema. Calves symmetric in size bilaterally.  Pulmonary/Chest: Effort normal and breath sounds normal. He exhibits no tenderness.  Abdominal: Soft. Bowel sounds are normal. There is no tenderness. There is no rebound and no guarding.  Musculoskeletal: He exhibits no edema.  Lymphadenopathy:    He has no cervical adenopathy.  Neurological: He is alert.  Skin: Skin is warm and dry. No rash noted. He is not diaphoretic.  Patient with raised skin colored scales/plaques present on posterior neck and No blisters, no pustules, no warmth, no draining sinus tracts, no superficial abscesses, no bullous impetigo, no vesicles, no desquamation, no target lesions with dusky purpura or a central bulla. Not tender to touch. No burrows between webspace's. No rash on palms or soles. No unilateral rash to suggest shingles.   Psychiatric: He has a normal mood and affect.  Nursing note and vitals reviewed.    ED Treatments / Results  Labs (all labs ordered are listed, but only abnormal results are displayed) Labs Reviewed - No data to display  EKG None  Radiology No results found.  Procedures Procedures (including critical care time)  Medications Ordered in ED Medications - No data to display   Initial Impression / Assessment and Plan / ED Course  I have reviewed the triage vital signs and the nursing notes.  Pertinent labs & imaging results that were available during my care of the patient were reviewed by me and considered in my medical decision making (see chart for details).     62 y.o. male with pruritic rash along neck line and arms. Rash consistent with contact dermatitis. No superimposed infection. Patient denies any difficulty breathing or swallowing.  Pt has a patent airway without stridor and is handling secretions without difficulty; no angioedema. No blisters, no pustules, no  warmth, no draining sinus tracts, no superficial abscesses, no bullous impetigo, no vesicles, no desquamation, no target lesions with dusky purpura or a central bulla. Not tender to touch. No concern for superimposed infection. No concern for SJS, TEN, TSS, tick borne illness, syphilis or other life-threatening condition. Will discharge home with short course of steroids, topical triamcinolone and recommend Benadryl as needed for pruritis. I advised the patient to follow-up with PCP this week. Specific return precautions discussed. Time was given for all questions to be answered. The patient verbalized understanding and agreement with plan. The patient appears safe for discharge home.  Final Clinical Impressions(s) / ED Diagnoses   Final diagnoses:  Rash    ED Discharge Orders        Ordered    predniSONE (DELTASONE) 50 MG tablet  Daily     06/14/17 2051    diphenhydrAMINE (BENADRYL) 25 MG tablet  Every 6 hours  06/14/17 2051    triamcinolone cream (KENALOG) 0.1 %  2 times daily     06/14/17 2051       Princella Pellegrini 06/15/17 1135    Tegeler, Canary Brim, MD 06/19/17 [phone]

## 2017-06-14 NOTE — Discharge Instructions (Addendum)
Please see attached handout.  Take medication as prescribed.  Please note if you are a diabetic, steroids can increase your blood sugar and you should monitor them at home.  Follow up with PCP this week.  If you develop worsening or new concerning symptoms you can return to the emergency department for re-evaluation.

## 2017-06-14 NOTE — ED Triage Notes (Signed)
Diffuse itchy rash x 3 weeks.

## 2017-06-14 NOTE — ED Notes (Signed)
Pt verbalizes understanding of d/c instructions and denies any further needs at this time. 

## 2017-06-28 DIAGNOSIS — J381 Polyp of vocal cord and larynx: Secondary | ICD-10-CM | POA: Diagnosis not present

## 2017-11-17 ENCOUNTER — Ambulatory Visit (INDEPENDENT_AMBULATORY_CARE_PROVIDER_SITE_OTHER): Payer: 59 | Admitting: Allergy

## 2017-11-17 ENCOUNTER — Encounter: Payer: Self-pay | Admitting: Allergy

## 2017-11-17 VITALS — BP 124/70 | HR 84 | Temp 98.1°F | Resp 16 | Ht 67.52 in | Wt 169.3 lb

## 2017-11-17 DIAGNOSIS — L2089 Other atopic dermatitis: Secondary | ICD-10-CM | POA: Diagnosis not present

## 2017-11-17 MED ORDER — EPINEPHRINE 0.3 MG/0.3ML IJ SOAJ: INTRAMUSCULAR | 1 refills | Status: AC

## 2017-11-17 NOTE — Patient Instructions (Addendum)
Severe Eczema   - Bathe and soak for 5-10 minutes in lukewarm water once a day. Pat dry.  Immediately apply the below cream prescribed to dry/itchy/flaky/patchy areas only. Wait 5-10 minutes and then apply your lotion (CeraVe) all over.   To eczema flared areas on the face and neck, apply: . Triamcinolone 0.1% ointment twice daily . Eucrisa ointment can be applied twice a day and can be layered with triamcinolone.  Provided with samples  To eczema flared areas on the body (below the face and neck), apply: . Temovate ointment apply twice daily (do not use for more than 2 weeks at a time) . Eucrisa ointment can be applied twice a day and can be layered with triamcinolone.  Provided with samples . With ointments be careful to avoid the armpits and groin area.   - Make a note of any foods that make eczema worse.   - Keep finger nails trimmed.   - recommend applying above creams to hands at bedtime and then apply CeraVe to hands and then wear cotton gloves over night.  This should help seal in moisture so skin is less prone to cracking.   - discussed Dupixent (dupilimab) injections today which is an eczema management medication.  Dupixent is given every 2 weeks and can be done at home if comfortable with injections.  Benefits and risks discussed today.  Consent to start Dupixent done today.  Dupixent brochure provided today.    - your allergy testing done by your PCP shows sensitivity to Dust mites, cat, dog, rat urine, grasses, trees, weeds, molds.   Provided with avoidance measures.       Food testing done by your PCP shows sensitivity to dairy, fish, wheat, soy, sesame seed, shrimp, egg.  You are eating fish, sesame seed, shrimp and egg without any issue thus keep in the diet.  Dairy causes GI symptoms thus would avoid in the diet for now and see if symptoms are prevented and if skin may be improved.   You do not eat wheat or soybean products in the diet and would continue to avoid due to  sensitivity.  Will provide you with an Epipen due to anticipation of starting Dupixent.    - Please call office and let us know exactly which creams/ointments and oral medications you are taking for your eczema care.  This will be helpful in determining any other oral medications that maybe helpful for you.  - we will obtain records from your dermatologist  Follow-up 3 months or sooner if needed

## 2017-11-17 NOTE — Progress Notes (Signed)
New Patient Note  RE: Adalberto Metzgar MRN: [id number] DOB: 10/30/1955 Date of Office Visit: 11/17/2017  Referring provider: List, Nash Shearer, FNP Primary care provider: List, Nash Shearer, FNP  Chief Complaint: eczema  History of present illness: Levi Simpson is a 62 y.o. male presenting today for consultation for eczema.     He states he developed what he thought was poison ivy on neck about 6-7 months ago but it continued to spread to his scalp, arms and back primarily.  The rash is itchy and continued to worsen so he saw dermatology at [facility] about 3 months ago.  He states he has been trying different creams to see what will help.  He denies having any biopsy of the rash done.  He believes he has used the following topical therapies without any relief of symptoms: lidex, topicort, ultravate, elocon, elidel.  He states he has also tried zyrtec, benadryl and prednisone that has not helped much either.   He believes he is currently using Triamcinolone for face/scalp and Temovate for body.  These are recently new prescriptions for him.   He states he is taking 3 pills but he does not know what these are.  He also reports completing a course of fluconazole and he currently continues to use selenium sulfide shampoo.  He states he is using CeraVe as his moisturizer daily and showers daily.  He does feel that heat worsens his rash but does not know of any other triggers.    He states he did not have any environmental changes 6-7 months ago and he does not work.  Denies any travel or illnesses prior.  He denies any atopic disease as a child and the eczema is new for him 6-7 months ago.  He denies any foods worsening the rash.  He denies any changes to soaps/lotions/detergents prior to onset of rash.     He has had allergy testing performed by his PCP that shows a total IgE 5542 IU/ml and in kU/L as follows: d. pteronyssinius 0.38, d. Farinae 0.37, cat dander 0.35, dog dander 0.39, rat urine 0.22,  [place] grass 0.38., meadow fescue 1.24, johnson grass 0.79, cockroach 0.4, cladosporium 0.31, aspergillus 0.2, alternaria 0.41, maple/box elder 0.43, birch 0.37, oak 0.73, walnut 0.37, pecan/hickory 0.33, ragweed 0.33; milk 1.11, codfish 0.31, wheat 1.86, sesame seed 0.19, peanut 0.63, soybean 0.27, shrimp 0.44, egg 2.6. He also had normal CBC without eosinophilia and an unremarkable CMP.  He denies any nasal congestion/drainage, itchy/watery/red eyes, sneezing, cough.  He denies any history of asthma or food allergy.   He does report that dairy causes an upset stomach but denies any worsening of his skin, CV or respiratory symptoms after ingestion.  He states he does eat fish, sesame seeds, peanut, shrimp and egg without issue and it does not worsen his skin.  He states he does not eat wheat or soybean products in the diet.     Review of systems: Review of Systems  Constitutional: Negative for chills, diaphoresis, fever, malaise/fatigue and weight loss.  HENT: Negative for congestion, ear discharge, ear pain, nosebleeds, sinus pain and sore throat.   Eyes: Negative for pain, discharge and redness.  Respiratory: Negative for cough, shortness of breath and wheezing.   Cardiovascular: Negative for chest pain.  Gastrointestinal: Negative for abdominal pain, constipation, diarrhea, heartburn, nausea and vomiting.  Musculoskeletal: Negative for joint pain.  Skin: Positive for itching and rash.  Neurological: Negative for headaches.    All other systems negative unless noted  above in HPI  Past medical history: Past Medical History:  Diagnosis Date  . Accidental fall from ladder 06/07/2016    fall from top of 24 ft ladder in tree w/left sided rib fractures 7-12, TPF of L1, L2, L3 and L4 vertebral bodies Hattie Perch 06/07/2016  . Arthritis    "knees, hands, back" (06/08/2016)  . Diabetes mellitus without complication (HCC)   . Eczema   . GERD (gastroesophageal reflux disease)   . High cholesterol     . Hypertension   . Type II diabetes mellitus (HCC)     Past surgical history: Past Surgical History:  Procedure Laterality Date  . APPENDECTOMY      Family history:  Family History  Problem Relation Age of Onset  . Allergic rhinitis Neg Hx   . Angioedema Neg Hx   . Asthma Neg Hx   . Eczema Neg Hx   . Immunodeficiency Neg Hx   . Urticaria Neg Hx     Social history: Lives in a home with carpeting with gas heating and central cooling.  No pets in the home.  No concern for water damage, mildew or roaches in the home.  He does not work.   Tobacco Use  . Smoking status: Current Every Day Smoker    Packs/day: 0.50    Years: 49.00    Pack years: 24.50    Types: Cigarettes  . Smokeless tobacco: Never Used    Medication List: Allergies as of 11/17/2017   No Known Allergies     Medication List        Accurate as of 11/17/17  4:01 PM. Always use your most recent med list.          cetirizine 10 MG tablet Commonly known as:  ZYRTEC Not taking yet just prescribed by dermatology   diphenhydrAMINE 25 MG tablet Commonly known as:  BENADRYL Take 1 tablet (25 mg total) by mouth every 6 (six) hours.   doxepin 25 MG capsule Commonly known as:  SINEQUAN Haven't taken yet prescribed by dermatology.   famotidine 40 MG tablet Commonly known as:  PEPCID Take by mouth.   fluocinonide ointment 0.05 % Commonly known as:  LIDEX ONE APPLICATION TWO TIMES D   metFORMIN 500 MG tablet Commonly known as:  GLUCOPHAGE Take by mouth 2 (two) times daily with a meal.   triamcinolone cream 0.1 % Commonly known as:  KENALOG Apply 1 application topically 2 (two) times daily.       Known medication allergies: No Known Allergies   Physical examination: Blood pressure 124/70, pulse 84, temperature 98.1 F (36.7 C), temperature source Oral, resp. rate 16, height 5' 7.52" (1.715 m), weight 169 lb 5 oz (76.8 kg), SpO2 98 %.  General: Alert, interactive, in no acute  distress. HEENT: PERRLA, TMs pearly gray, turbinates non-edematous without discharge, post-pharynx non erythematous. Neck: Supple without lymphadenopathy. Lungs: Clear to auscultation without wheezing, rhonchi or rales. {no increased work of breathing. CV: Normal S1, S2 without murmurs. Abdomen: Nondistended, nontender. Skin: very dry hyperpigmented lichenfied skin of the dorsal surface of hands and nape of neck and scalp with cracking; b/l ears with flaking; hyperpigmentation of the arms extending to the elbows, back. Extremities:  No clubbing, cyanosis or edema. Neuro:   Grossly intact.  Diagnositics/Labs: Labs: see HPI  Allergy testing: deferred due to severity of skin  Assessment and plan:   Severe Eczema   - he has new onset eczema with no previous history of atopy.  No triggering events.  Per pt report he has not had a biopsy of this rash.  [facility] and will request these [facility].  I would like for him to have a second opinion and biopsy of rash to confirm this is indeed eczema.  He has not had significant improvement with standard eczema therapy including topical steroids and non-steroidal agent pimecrolimus (per pt report).  Will send referral to [facility].     - Bathe and soak for 5-10 minutes in lukewarm water once a day. Pat dry.  Immediately apply the below cream prescribed to dry/itchy/flaky/patchy areas only. Wait 5-10 minutes and then apply your lotion (CeraVe) all over.   To eczema flared areas on the face and neck, apply: . Triamcinolone 0.1% ointment twice daily . Eucrisa ointment can be applied twice a day and can be layered with triamcinolone.  Provided with samples  To eczema flared areas on the body (below the face and neck), apply: . Temovate ointment apply twice daily (do not use for more than 2 weeks at a time) . Eucrisa ointment can be applied twice a day and can be layered with triamcinolone.  Provided with  samples . With ointments be careful to avoid the armpits and groin area.   - Make a note of any foods that make eczema worse.   - Keep finger nails trimmed.   - recommend applying above creams to hands at bedtime and then apply CeraVe to hands and then wear cotton gloves overnight.  This should help seal in moisture so skin is less prone to cracking.   - discussed Dupixent injections today which is an eczema management medication.  Dupixent is given every 2 weeks and can be done at home if comfortable with injections.  Benefits and risks discussed today.  Consent to start Dupixent done today.  Dupixent brochure provided today.    - your allergy testing done by your PCP shows sensitivity to Dust mites, cat, dog, rat urine, grasses, trees, weeds, molds.   Provided with avoidance measures.       Food testing done by your PCP shows sensitivity to dairy, fish, wheat, soy, sesame seed, shrimp, egg.  You are eating fish, sesame seed, shrimp and egg without any issue thus keep in the diet.  Dairy causes GI symptoms thus would avoid in the diet for now and see if symptoms are prevented and if skin may be improved.   You do not eat wheat or soybean products in the diet and would continue to avoid due to sensitivity.  Will provide you with an Epipen due to anticipation of starting Dupixent.    - Please call office and let us know exactly which creams/ointments and oral medications you are taking for your eczema care.  This will be helpful in determining any other oral medications that maybe helpful for you.  - we will obtain records from your dermatologist  Follow-up 3 months or sooner if needed   I appreciate the opportunity to take part in Takari's care. Please do not hesitate to contact me with questions.  Sincerely,   Margo AyeShaylar Damoney Julia, MD Allergy/Immunology [facility]

## 2017-11-18 ENCOUNTER — Other Ambulatory Visit: Payer: Self-pay

## 2017-11-18 ENCOUNTER — Telehealth: Payer: Self-pay | Admitting: Allergy

## 2017-11-18 MED ORDER — CRISABOROLE 2 % EX OINT: 1.0000 "application " | TOPICAL_OINTMENT | Freq: Two times a day (BID) | CUTANEOUS | 3 refills | Status: DC | PRN

## 2017-11-18 NOTE — Progress Notes (Signed)
Referral has been typed up and [facility]  [place] Migliardi's office  Instructed to set up and office visit and biopsy for  New onset eczema

## 2017-11-18 NOTE — Telephone Encounter (Signed)
Thanks

## 2017-11-18 NOTE — Telephone Encounter (Signed)
Levi Simpson has been e-scribed and I called pt to let him know.

## 2017-11-18 NOTE — Telephone Encounter (Signed)
PA has been submitted through covermymeds.

## 2017-11-18 NOTE — Telephone Encounter (Signed)
-----   Message from [facility] Larose HiresPatricia Padgett, MD sent at 11/18/2017 10:12 AM EST ----- Regarding: RE: derm referral Great thanks.  ----- Message ----- From: Ray Churchoggins Thompson, Celene Skeenickie M Sent: 11/18/2017   9:34 AM EST To: Marcelyn BruinsShaylar Patricia Padgett, MD Subject: RE: derm referral                              Referral has been typed up and [facility] office Instructed to set up and office visit and biopsy for New onset eczema   ----- Message ----- From: Marcelyn BruinsPadgett, Shaylar Patricia, MD Sent: 11/17/2017   4:31 PM EST To: Dyasia A Neal-Mims, NT, # Subject: derm referral                                  [facility]  I'm not satisfied with the care.    Would like referral to [facility] in [place] (any of the MDs are fine).    Reason: new onset severe eczema in adult without history of biopsy.  eval for need for biopsy.

## 2017-11-18 NOTE — Telephone Encounter (Signed)
Pt called with medications that were given to him from his dermatologist.   Hydrocortisone Doxepin 25mg   Fluocinonide 0.1%/15mg 

## 2017-11-18 NOTE — Telephone Encounter (Signed)
Ok thanks.    Please see if Levi Simpson is covered for him and prescribe for him.   We have [facility].

## 2017-11-21 ENCOUNTER — Telehealth: Payer: Self-pay

## 2017-11-21 NOTE — Telephone Encounter (Signed)
Received fax that Eucrisa 2% has been approved.

## 2017-11-30 ENCOUNTER — Ambulatory Visit (INDEPENDENT_AMBULATORY_CARE_PROVIDER_SITE_OTHER): Payer: 59

## 2017-11-30 DIAGNOSIS — L209 Atopic dermatitis, unspecified: Secondary | ICD-10-CM | POA: Diagnosis not present

## 2017-12-05 ENCOUNTER — Other Ambulatory Visit: Payer: Self-pay | Admitting: Allergy

## 2017-12-14 ENCOUNTER — Ambulatory Visit: Payer: Self-pay

## 2017-12-15 ENCOUNTER — Ambulatory Visit (INDEPENDENT_AMBULATORY_CARE_PROVIDER_SITE_OTHER): Payer: 59 | Admitting: *Deleted

## 2017-12-15 DIAGNOSIS — L209 Atopic dermatitis, unspecified: Secondary | ICD-10-CM | POA: Diagnosis not present

## 2017-12-15 NOTE — Progress Notes (Signed)
Immunotherapy   Patient Details  Name: Levi Simpson MRN: [id number] Date of Birth: 1955/02/13  12/15/2017  Levi Simpson received dupixent 300mg  injection in left arm  Frequency: every 2 weeks Epi-Pen:Epi-Pen Available  Consent signed and patient instructions given. NDC [id number] LOT [id number] EXP 11/2019 Pt left without waiting.   Maurine SimmeringLogan D Shavonna Corella 12/15/2017, 4:16 PM

## 2017-12-30 ENCOUNTER — Ambulatory Visit (INDEPENDENT_AMBULATORY_CARE_PROVIDER_SITE_OTHER): Payer: 59 | Admitting: *Deleted

## 2017-12-30 DIAGNOSIS — L209 Atopic dermatitis, unspecified: Secondary | ICD-10-CM

## 2018-01-13 ENCOUNTER — Ambulatory Visit (INDEPENDENT_AMBULATORY_CARE_PROVIDER_SITE_OTHER): Payer: 59 | Admitting: *Deleted

## 2018-01-13 DIAGNOSIS — L209 Atopic dermatitis, unspecified: Secondary | ICD-10-CM | POA: Diagnosis not present

## 2018-01-13 NOTE — Progress Notes (Signed)
Immunotherapy   Patient Details  Name: Levi Simpson MRN: [id number] Date of Birth: [birth year]  01/13/2018  [name] DUPIXENT IN LEFT ARM Frequency: EVERY 2 WEEKS Epi-Pen:Epi-Pen Available  Consent signed and patient instructions given.   [name] 01/13/2018, 1:49 PM

## 2018-01-27 ENCOUNTER — Ambulatory Visit (INDEPENDENT_AMBULATORY_CARE_PROVIDER_SITE_OTHER): Payer: 59 | Admitting: *Deleted

## 2018-01-27 DIAGNOSIS — L209 Atopic dermatitis, unspecified: Secondary | ICD-10-CM

## 2018-02-10 ENCOUNTER — Ambulatory Visit (INDEPENDENT_AMBULATORY_CARE_PROVIDER_SITE_OTHER): Payer: 59 | Admitting: *Deleted

## 2018-02-10 DIAGNOSIS — L209 Atopic dermatitis, unspecified: Secondary | ICD-10-CM

## 2018-02-24 ENCOUNTER — Ambulatory Visit (INDEPENDENT_AMBULATORY_CARE_PROVIDER_SITE_OTHER): Payer: 59 | Admitting: *Deleted

## 2018-02-24 DIAGNOSIS — L209 Atopic dermatitis, unspecified: Secondary | ICD-10-CM | POA: Diagnosis not present

## 2018-02-24 NOTE — Progress Notes (Signed)
Immunotherapy   Patient Details  Name: Levi Simpson MRN: [id number] Date of Birth: 30-Aug-1955  02/24/2018  Levi Simpson received Dupixent 2 mL in the Right arm  Frequency: Every 2 weeks Epi-Pen:Epi-Pen Available  Consent signed and patient instructions given.   Levi Simpson 02/24/2018, 1:21 PM

## 2018-03-09 ENCOUNTER — Ambulatory Visit (INDEPENDENT_AMBULATORY_CARE_PROVIDER_SITE_OTHER): Payer: 59 | Admitting: Allergy

## 2018-03-09 ENCOUNTER — Encounter: Payer: Self-pay | Admitting: Allergy

## 2018-03-09 DIAGNOSIS — L209 Atopic dermatitis, unspecified: Secondary | ICD-10-CM

## 2018-03-09 DIAGNOSIS — L2084 Intrinsic (allergic) eczema: Secondary | ICD-10-CM

## 2018-03-09 NOTE — Patient Instructions (Addendum)
Severe Eczema   - Integrity of the skin is much improved after starting on Dupixent.  Skin is well moisturized and there is no cracking or fissuring now.     - I did reach out to Dr. Cline Crock, medical science liason with Dupixent, who postulated that given his severe degree of chronic inflammation prior to starting on Dupixent that his melanocytes may have been damaged due to the inflammation and now that his skin is recovering that the melanocytes may not have been able to replenish his pigment.  It is unsure if the pigment will fully return or if it will stay in its current state.  He did advise that there was the option to continue on Dupixent or to stop Dupixent however his pigmentation may not fully recover if we do stop Dupixent.  Given that the Mr. Ackroyd quality of life is much improved now that he does not have any more pruritus and that his skin is intact that he may gain more benefit from staying on Dupixent versus stopping.  -I did discuss this with Mr. Truesdell and he is aware that his pigmentation may not fully recover and I did give him the option of continuing on Dupixent versus stopping Dupixent.  He stated that he wanted to continue on Dupixent and we will follow closely how his pigment changes over time.  I will have him come back in a month to reassess.    -He will continue routine care for his eczema.  With daily bathing with moisturization afterwards.  For flareups he will continued the use of Temovate and or Eucrisa for his body and triamcinolone 0.1% ointment and or Eucrisa for his face and neck.     - I advised that he should be performing good skin care with use of sunscreen especially if he is going to be outdoors.   - If there appears to be no return of pigmentation then we can resend a referral for dermatology.    Follow-up 1 month or sooner if needed

## 2018-03-09 NOTE — Progress Notes (Signed)
Follow-up Note  RE: Levi Simpson MRN: [id number] DOB: [birth year] Date of Office Visit: 03/09/2018   History of present illness: Levi Simpson is a 63 y.o. male presenting today for follow-up of severe eczema.  He was last seen in the office on November 17, 2017 by myself for his initial visit.  At this visit I had recommended that he have a second opinion from another dermatologist with possible biopsy performed to confirm his eczema diagnosis.  A referral was made for Dr. Naoma Diener however patient never went to this appointment.    Since his last visit he was started on Dupixent but he has been coming to the office to receive every 2 weeks.  Since he has been on Dupixent he states that he is no longer itching and he is not needing to wear his cotton gloves all the time.  He also states that he no longer needs or is using any topical steroids or nonsteroidal agents.  However about a month after starting Dupixent he started noticing lighter areas in the places where he had more severe chronic eczema changes.  Even with the this hypopigmentation he feels that his skin is much improved over how he was previously and is happy that he is no longer itching.  Otherwise he denies any major health changes, surgeries or hospitalizations since his last visit.  Review of systems: Review of Systems  Constitutional: Negative for chills, fever and malaise/fatigue.  HENT: Negative for congestion, ear discharge, nosebleeds and sore throat.   Eyes: Negative for pain, discharge and redness.  Respiratory: Negative for cough, shortness of breath and wheezing.   Cardiovascular: Negative for chest pain.  Gastrointestinal: Negative for abdominal pain, constipation, diarrhea, heartburn, nausea and vomiting.  Musculoskeletal: Negative for joint pain.  Skin: Positive for rash. Negative for itching.  Neurological: Negative for headaches.    All other systems negative unless noted above in HPI  Past  medical/social/surgical/family history have been reviewed and are unchanged unless specifically indicated below.  No changes  Medication List: Allergies as of 03/09/2018   No Known Allergies     Medication List       Accurate as of March 09, 2018  5:44 PM. Always use your most recent med list.        cetirizine 10 MG tablet Commonly known as:  ZYRTEC Not taking yet just prescribed by dermatology   Crisaborole 2 % Oint Commonly known as:  [place] Apply 1 application topically 2 (two) times daily as needed.   diphenhydrAMINE 25 MG tablet Commonly known as:  BENADRYL Take 1 tablet (25 mg total) by mouth every 6 (six) hours.   doxepin 25 MG capsule Commonly known as:  SINEQUAN Haven't taken yet prescribed by dermatology.   Dupixent 300 MG/2ML Sosy Generic drug:  Dupilumab INJECT 600MG  (2 SYRINGES)  SUBCUTANEOUSLY ON DAY 1,  300MG  (1 SYRINGE) DAY 15,  THEN EVERY OTHER WEEK  THEREAFTER   EPINEPHrine 0.3 mg/0.3 mL Soaj injection Commonly known as:  EPI-PEN Use as directed for severe allergic reactions   famotidine 40 MG tablet Commonly known as:  PEPCID Take by mouth.   fluocinonide ointment 0.05 % Commonly known as:  LIDEX ONE APPLICATION TWO TIMES D   metFORMIN 500 MG tablet Commonly known as:  GLUCOPHAGE Take by mouth 2 (two) times daily with a meal.   triamcinolone cream 0.1 % Commonly known as:  KENALOG Apply 1 application topically 2 (two) times daily.       Known medication  allergies: No Known Allergies   Physical examination: Blood pressure 120/72, pulse 92, resp. rate 17, SpO2 96 %.  General: Alert, interactive, in no acute distress. HEENT: PERRLA, TMs pearly gray, turbinates non-edematous without discharge, post-pharynx non erythematous. Neck: Supple without lymphadenopathy. Lungs: Clear to auscultation without wheezing, rhonchi or rales. {no increased work of breathing. CV: Normal S1, S2 without murmurs. Abdomen: Nondistended,  nontender. Skin: Skin overall is intact and well moisturized without any fissures or cracking.  However he does have significant degree of hypopigmentation over his arms extending over his fingers, scalp extending around his face but does not involve his forehead eyelids, cheeks nose or chin areas.  He also has several hypopigmented patches over his upper chest and neck.  The hypopigmented areas were previously very lichenified, hyperpigmented super dry cracked and fissured on previous visit.Marland Kitchen Extremities:  No clubbing, cyanosis or edema. Neuro:   Grossly intact.  Diagnositics/Labs: None today  Assessment and plan:   Severe Eczema   - Integrity of the skin is much improved after starting on Dupixent.  Skin is well moisturized and there is no cracking or fissuring now.     - I did reach out to Dr. Cline Crock, medical science liason with Dupixent, who postulated that given his severe degree of chronic inflammation prior to starting on Dupixent that his melanocytes may have been damaged due to the inflammation and now that his skin is recovering that the melanocytes may not have been able to replenish his pigment.  It is unsure if the pigment will fully return or if it will stay in its current state.  He did advise that there was the option to continue on Dupixent or to stop Dupixent however his pigmentation may not fully recover if we do stop Dupixent.  Given that the Levi Simpson quality of life is much improved now that he does not have any more pruritus and that his skin is intact that he may gain more benefit from staying on Dupixent versus stopping.  -I did discuss this with Levi Simpson and he is aware that his pigmentation may not fully recover and I did give him the option of continuing on Dupixent versus stopping Dupixent.  He stated that he wanted to continue on Dupixent and we will follow closely how his pigment changes over time.  I will have him come back in a month to reassess.    -He will  continue routine care for his eczema.  With daily bathing with moisturization afterwards.  For flareups he will continued the use of Temovate and or Eucrisa for his body and triamcinolone 0.1% ointment and or Eucrisa for his face and neck.     - I advised that he should be performing good skin care with use of sunscreen especially if he is going to be outdoors.   - If there appears to be no return of pigmentation then we can resend a referral for dermatology.    Follow-up 1 month or sooner if needed  I appreciate the opportunity to take part in Levi Simpson's care. Please do not hesitate to contact me with questions.  Sincerely,   Margo Aye, MD Allergy/Immunology [facility]

## 2018-03-10 ENCOUNTER — Ambulatory Visit: Payer: Self-pay

## 2018-03-24 ENCOUNTER — Other Ambulatory Visit: Payer: Self-pay

## 2018-03-24 ENCOUNTER — Ambulatory Visit (INDEPENDENT_AMBULATORY_CARE_PROVIDER_SITE_OTHER): Payer: 59 | Admitting: *Deleted

## 2018-03-24 DIAGNOSIS — L209 Atopic dermatitis, unspecified: Secondary | ICD-10-CM

## 2018-04-06 ENCOUNTER — Other Ambulatory Visit: Payer: Self-pay

## 2018-04-06 ENCOUNTER — Ambulatory Visit (INDEPENDENT_AMBULATORY_CARE_PROVIDER_SITE_OTHER): Payer: 59 | Admitting: Allergy

## 2018-04-06 ENCOUNTER — Encounter: Payer: Self-pay | Admitting: Allergy

## 2018-04-06 DIAGNOSIS — L2084 Intrinsic (allergic) eczema: Secondary | ICD-10-CM

## 2018-04-06 MED ORDER — FLUOCINONIDE 0.05 % EX OINT: TOPICAL_OINTMENT | CUTANEOUS | 5 refills | Status: DC

## 2018-04-06 MED ORDER — TRIAMCINOLONE ACETONIDE 0.1 % EX CREA: TOPICAL_CREAM | CUTANEOUS | 5 refills | Status: DC

## 2018-04-06 NOTE — Progress Notes (Signed)
RE: Levi Simpson MRN: [id number] DOB: 12-27-1955 Date of Telemedicine Visit: 04/06/2018  Referring provider: List, Nash Shearer, FNP Primary care provider: List, Nash Shearer, FNP  Chief Complaint:  Follow-up eczema  Telemedicine Follow Up Visit via Telephone: I connected with Levi Simpson for a follow up on 04/06/18 by telephone and verified that I am speaking with the correct person using two identifiers.   I discussed the limitations, risks, security and privacy concerns of performing an evaluation and management service by telephone and the availability of in person appointments. I also discussed with the patient that there may be a patient responsible charge related to this service. The patient expressed understanding and agreed to proceed.  Patient is in his car at home. Provider is at the office.  Visit start time: 9:51am Visit end time: 10:07am Insurance consent/check in by: Callie Fielding Medical consent and medical assistant/nurse: Vonzell Schlatter  History of Present Illness: He is a 63 y.o. male, who is being followed for atopic dermatitis. His previous allergy office visit was on 03/09/2018 with Dr. Delorse Lek.   He is receiving dupixent injections every 2 weeks for management of eczema.  At last visit he states that he was no longer itching and was feeling much better and was not needing to wear his gloves anymore however the more severe eczema areas while healed were de-pigmented.  He received his last dupixent injection on 03/24/2018 and is due for next one tomorrow.  He states he has had some return of itching but it is not as bad as before and is noticing some flakiness of his skin.  He does not have any topical steroids at this time.  He states he is not sure if the de-pigmentation is spreading or getting more pronounced or not.    Assessment and Plan: Levi Simpson is a 63 y.o. male with:  Severe Eczema   - Integrity of the skin is much improved after starting on Dupixent.  Skin is now well moisturized and  the cracking or fissuring has resolved however these areas are now de-pigmented   - I reached out to Dr. Cline Crock, medical science liason with Dupixent, who postulated that given his severe degree of chronic inflammation prior to starting on Dupixent that his melanocytes may have been damaged due to the inflammation and now that his skin is recovering that the melanocytes may not have been able to replenish his pigment.  It is unsure if the pigment will fully return or if it will stay in its current state.  He did advise that there was the option to continue on Dupixent or to stop Dupixent however his pigmentation may not fully recover if we do stop Dupixent.  Given that the Levi Simpson quality of life is much improved now that he does not have significant pruritus and that his skin is intact that he may gain more benefit from staying on Dupixent versus stopping.  -I did discuss this with [name] and he is aware that his pigmentation may not fully recover and I did give him the option of continuing on Dupixent versus stopping Dupixent.  He stated that he wanted to continue on Dupixent and we will follow closely how his pigment changes over time.  -will plan to have pictures of his hands/arms and scalp taking when he comes for his dupixent injection to compare if pigmentation is returning or if he continues to lose pigment -He will continue routine care for his eczema.  With daily bathing with moisturization afterwards.  For flareups he will continued the use of Lidex for his body and triamcinolone 0.1% ointment for his face and neck.     - I advised that he should be performing good skin care with use of sunscreen especially if he is going to be outdoors.   - If there appears to be no return of pigmentation then we can resend a referral for dermatology.    Follow-up 1 month or sooner if needed  Diagnostics: None.  Medication List:  Current Outpatient Medications  Medication Sig Dispense Refill  .  DUPIXENT 300 MG/2ML SOSY INJECT 600MG  (2 SYRINGES)  SUBCUTANEOUSLY ON DAY 1,  300MG  (1 SYRINGE) DAY 15,  THEN EVERY OTHER WEEK  THEREAFTER 4 mL 10  . EPINEPHrine 0.3 mg/0.3 mL IJ SOAJ injection Use as directed for severe allergic reactions (Patient not taking: Reported on 04/06/2018) 2 Device 1  . fluocinonide ointment (LIDEX) 0.05 % ONE APPLICATION TWO TIMES DAILY FOR SEVERE AREAS. DO NOT USE ON FACE 30 g 5  . triamcinolone cream (KENALOG) 0.1 % ONE APPLICATION TWO TIMES DAILY FOR FOR MILD AREAS 30 g 5   No current facility-administered medications for this visit.    Allergies: No Known Allergies I reviewed his past medical history, social history, family history, and environmental history and no significant changes have been reported from previous visit on 03/09/2018.  Review of Systems  Constitutional: Negative for chills and fever.  HENT: Negative for congestion, postnasal drip, rhinorrhea and sneezing.   Eyes: Negative for itching.  Respiratory: Negative for cough, chest tightness, shortness of breath and wheezing.   Cardiovascular: Negative.   Gastrointestinal: Negative for abdominal pain, constipation, diarrhea, nausea and vomiting.  Musculoskeletal: Negative for myalgias.  Skin: Positive for color change and rash.  Neurological: Negative for headaches.   Objective: Physical Exam Not obtained as encounter was done via telephone.   Previous notes and tests were reviewed.  I discussed the assessment and treatment plan with the patient. The patient was provided an opportunity to ask questions and all were answered. The patient agreed with the plan and demonstrated an understanding of the instructions.   The patient was advised to call back or seek an in-person evaluation if the symptoms worsen or if the condition fails to improve as anticipated.  I provided 16 minutes of non-face-to-face time during this encounter.  It was my pleasure to participate in Levi Simpson's care today.  Please feel free to contact me with any questions or concerns.   Sincerely,  Temesgen Weightman Larose Hires, MD [facility]

## 2018-04-06 NOTE — Patient Instructions (Addendum)
Severe Eczema   - Integrity of the skin is much improved after starting on Dupixent.  Skin is now well moisturized and the cracking or fissuring has resolved however these areas are now de-pigmented   - I reached out to Dr. Cline Crock, medical science liason with Dupixent, who postulated that given his severe degree of chronic inflammation prior to starting on Dupixent that his melanocytes may have been damaged due to the inflammation and now that his skin is recovering that the melanocytes may not have been able to replenish his pigment.  It is unsure if the pigment will fully return or if it will stay in its current state.  He did advise that there was the option to continue on Dupixent or to stop Dupixent however his pigmentation may not fully recover if we do stop Dupixent.  Given that the Mr. Levi Simpson quality of life is much improved now that he does not have significant pruritus and that his skin is intact that he may gain more benefit from staying on Dupixent versus stopping.  -I did discuss this with Mr. [name] and he is aware that his pigmentation may not fully recover and I did give him the option of continuing on Dupixent versus stopping Dupixent.  He stated that he wanted to continue on Dupixent and we will follow closely how his pigment changes over time.  -will plan to have pictures of his hands/arms and scalp taking when he comes for his dupixent injection to compare if pigmentation is returning or if he continues to lose pigment -He will continue routine care for his eczema.  With daily bathing with moisturization afterwards.  For flareups he will continued the use of Lidex for his body and triamcinolone 0.1% ointment for his face and neck.     - I advised that he should be performing good skin care with use of sunscreen especially if he is going to be outdoors.   - If there appears to be no return of pigmentation then we can resend a referral for dermatology.    Follow-up 1 month or sooner if  needed

## 2018-04-07 ENCOUNTER — Ambulatory Visit (INDEPENDENT_AMBULATORY_CARE_PROVIDER_SITE_OTHER): Payer: 59

## 2018-04-07 DIAGNOSIS — L2084 Intrinsic (allergic) eczema: Secondary | ICD-10-CM

## 2018-04-21 ENCOUNTER — Other Ambulatory Visit: Payer: Self-pay

## 2018-04-21 ENCOUNTER — Ambulatory Visit (INDEPENDENT_AMBULATORY_CARE_PROVIDER_SITE_OTHER): Payer: 59

## 2018-04-21 DIAGNOSIS — L209 Atopic dermatitis, unspecified: Secondary | ICD-10-CM | POA: Diagnosis not present

## 2018-04-21 NOTE — Progress Notes (Signed)
Immunotherapy   Patient Details  Name: Kenshawn Lyden MRN: [id number] Date of Birth: [birth year]  04/21/2018  [name]  Pt here for dupixent 30mg /66ml injection lot [id number] exp 02/2020 Following schedule: q2wk  Frequency:q2wk Epi-Pen:yes Consent signed and patient instructions given.   Berna Bue 04/21/2018, 10:53 AM

## 2018-05-05 ENCOUNTER — Other Ambulatory Visit: Payer: Self-pay

## 2018-05-05 ENCOUNTER — Ambulatory Visit (INDEPENDENT_AMBULATORY_CARE_PROVIDER_SITE_OTHER): Payer: 59

## 2018-05-05 DIAGNOSIS — L209 Atopic dermatitis, unspecified: Secondary | ICD-10-CM

## 2018-05-05 NOTE — Progress Notes (Signed)
Dupixent given today. Lot #[id number] Exp. 02/22

## 2018-05-18 ENCOUNTER — Ambulatory Visit (INDEPENDENT_AMBULATORY_CARE_PROVIDER_SITE_OTHER): Payer: 59 | Admitting: Allergy

## 2018-05-18 ENCOUNTER — Encounter: Payer: Self-pay | Admitting: Allergy

## 2018-05-18 DIAGNOSIS — L2084 Intrinsic (allergic) eczema: Secondary | ICD-10-CM

## 2018-05-18 MED ORDER — TRIAMCINOLONE ACETONIDE 0.1 % EX CREA: TOPICAL_CREAM | CUTANEOUS | 5 refills | Status: DC

## 2018-05-18 MED ORDER — FLUOCINONIDE 0.05 % EX OINT: TOPICAL_OINTMENT | CUTANEOUS | 5 refills | Status: DC

## 2018-05-18 NOTE — Progress Notes (Signed)
RE: Levi Simpson MRN: [id number] DOB: March 24, 1955 Date of Telemedicine Visit: 05/18/2018  Referring provider: List, Nash Shearer, FNP Primary Simpson provider: List, Nash Shearer, FNP  Chief Complaint: Eczema   Telemedicine Follow Up Visit via Telephone: I connected with Levi Simpson for a follow up on 05/18/18 by telephone and verified that I am speaking with the correct person using two identifiers.   I discussed the limitations, risks, security and privacy concerns of performing an evaluation and management service by telephone and the availability of in person appointments. I also discussed with the patient that there may be a patient responsible charge related to this service. The patient expressed understanding and agreed to proceed.  Patient is at home. Provider is at the office.  Visit start time: 1104 Visit end time: 1120 Insurance consent/check in by: Desma Paganini C Medical consent and medical assistant/nurse: Tempie Donning  History of Present Illness: He is a 63 y.o. male, who is being followed for severe atopic dermatitis. His previous allergy office visit was on 04/06/18 with Dr. Delorse Lek.  He states he has been doing very well and staying home as much as possible.  He states his skin is about the same as last visit but he is still less itchy and doesn't have any areas that are dry or cracked.  He will use his kenalog and lidex as needed for itch.  He continues on Dupixent injections every 2 weeks and tolerating injections well.  He did go see a dermatologist in [place] recently and states they took biopsies from 2 spots and results are pending.  He states dermatologist said he "100% has vitiligo" and that it is "good idea to continue dupixent".  He states he was given a pack of pills that he was to take "5 pills, then 4 pills, then 3 pills like that".     Assessment and Plan: Levi Simpson is a 63 y.o. male with: Patient Instructions  Severe Eczema   - Integrity of the skin is much improved after  starting on Dupixent.  Skin has been well moisturized and the cracking or fissuring has resolved however these areas are now very hypopigmented   - I reached out to Dr. Cline Crock, medical science liason with Dupixent, who postulated that given his severe degree of chronic inflammation prior to starting on Dupixent that his melanocytes may have been damaged due to the inflammation and now that his skin is recovering that the melanocytes may not have been able to replenish his pigment.  It is unsure if the pigment will fully return or if it will stay in its current state.  He did advise that there was the option to continue on Dupixent or to stop Dupixent however his pigmentation may not fully recover if we do stop Dupixent.  Given that the Levi Simpson is much improved now that he does not have significant pruritus and that his skin is intact that he may gain more benefit from staying on Dupixent versus stopping.  - I have discussed this with Levi Simpson and he is aware that his pigmentation may not fully recover and I did give him the option of continuing on Dupixent versus stopping Dupixent.  He stated that he wanted to continue on Dupixent and we will follow closely how his pigment changes over time.   - he has recently been evaluated by dermatologist who reports he has Vitiligo (biopsies pending) and they are currently treating his vitiligo and recommends he continues on Dupixent for continued control  of his eczema.  -He will continue routine Simpson for his eczema.  With daily bathing with moisturization afterwards.  For flareups he will continued the use of Lidex for his body and triamcinolone 0.1% ointment for his face and neck.    - I advised that he should be performing good skin Simpson with use of sunscreen especially if he is going to be outdoors.  Follow-up 2 month or sooner if needed  Diagnostics: None.  Medication List:  Current Outpatient Medications  Medication Sig Dispense  Refill  . DUPIXENT 300 MG/2ML SOSY INJECT 600MG  (2 SYRINGES)  SUBCUTANEOUSLY ON DAY 1,  300MG  (1 SYRINGE) DAY 15,  THEN EVERY OTHER WEEK  THEREAFTER 4 mL 10  . EPINEPHrine 0.3 mg/0.3 mL IJ SOAJ injection Use as directed for severe allergic reactions 2 Device 1  . fluocinonide ointment (LIDEX) 0.05 % ONE APPLICATION TWO TIMES DAILY FOR SEVERE AREAS. DO NOT USE ON FACE 30 g 5  . triamcinolone cream (KENALOG) 0.1 % ONE APPLICATION TWO TIMES DAILY FOR FOR MILD AREAS 30 g 5   No current facility-administered medications for this visit.    Allergies: No Known Allergies I reviewed his past medical history, social history, family history, and environmental history and no significant changes have been reported from previous visit on 04/06/18.  Review of Systems  Constitutional: Negative for chills and fever.  HENT: Negative for congestion, postnasal drip, rhinorrhea and sneezing.   Eyes: Negative for discharge, redness and itching.  Respiratory: Negative for cough, chest tightness, shortness of breath and wheezing.   Cardiovascular: Negative.   Gastrointestinal: Negative.   Musculoskeletal: Negative for myalgias.  Skin: Positive for color change. Negative for rash.  Neurological: Negative for headaches.   Objective: Physical Exam Not obtained as encounter was done via telephone.   Previous notes and tests were reviewed.  I discussed the assessment and treatment plan with the patient. The patient was provided an opportunity to ask questions and all were answered. The patient agreed with the plan and demonstrated an understanding of the instructions.   The patient was advised to call back or seek an in-person evaluation if the symptoms worsen or if the condition fails to improve as anticipated.  I provided 16 minutes of non-face-to-face time during this encounter.  It was my pleasure to participate in Levi Simpson today. Please feel free to contact me with any questions or concerns.    Sincerely,  Stepheny Canal Larose HiresPatricia Anniah Glick, MD

## 2018-05-18 NOTE — Patient Instructions (Addendum)
Severe Eczema   - Integrity of the skin is much improved after starting on Dupixent.  Skin has been well moisturized and the cracking or fissuring has resolved however these areas are now very hypopigmented   - I reached out to Dr. Cline Crock, medical science liason with Dupixent, who postulated that given his severe degree of chronic inflammation prior to starting on Dupixent that his melanocytes may have been damaged due to the inflammation and now that his skin is recovering that the melanocytes may not have been able to replenish his pigment.  It is unsure if the pigment will fully return or if it will stay in its current state.  He did advise that there was the option to continue on Dupixent or to stop Dupixent however his pigmentation may not fully recover if we do stop Dupixent.  Given that the Mr. Trivino quality of life is much improved now that he does not have significant pruritus and that his skin is intact that he may gain more benefit from staying on Dupixent versus stopping.  - I have discussed this with Mr. Suniga and he is aware that his pigmentation may not fully recover and I did give him the option of continuing on Dupixent versus stopping Dupixent.  He stated that he wanted to continue on Dupixent and we will follow closely how his pigment changes over time.   - he has recently been evaluated by dermatologist who reports he has Vitiligo (biopsies pending) and they are currently treating his vitiligo and recommends he continues on Dupixent for continued control of his eczema.  -He will continue routine care for his eczema.  With daily bathing with moisturization afterwards.  For flareups he will continued the use of Lidex for his body and triamcinolone 0.1% ointment for his face and neck.    - I advised that he should be performing good skin care with use of sunscreen especially if he is going to be outdoors.  Follow-up 2 month or sooner if needed

## 2018-05-19 ENCOUNTER — Ambulatory Visit (INDEPENDENT_AMBULATORY_CARE_PROVIDER_SITE_OTHER): Payer: 59

## 2018-05-19 ENCOUNTER — Other Ambulatory Visit: Payer: Self-pay

## 2018-05-19 DIAGNOSIS — L209 Atopic dermatitis, unspecified: Secondary | ICD-10-CM | POA: Diagnosis not present

## 2018-06-02 ENCOUNTER — Other Ambulatory Visit: Payer: Self-pay

## 2018-06-02 ENCOUNTER — Ambulatory Visit (INDEPENDENT_AMBULATORY_CARE_PROVIDER_SITE_OTHER): Payer: 59

## 2018-06-02 DIAGNOSIS — L209 Atopic dermatitis, unspecified: Secondary | ICD-10-CM

## 2018-06-02 NOTE — Progress Notes (Signed)
Immunotherapy   Patient Details  Name: Levi Simpson MRN: [id number] Date of Birth: Mar 05, 1955  06/02/2018  [name]  dupxent 300mg /66ml given (L) arm Frequency: every 2 weeks Epi-Pen:Epi-Pen Available  Consent signed and patient instructions given.   [name] 06/02/2018, 10:18 AM

## 2018-06-15 ENCOUNTER — Other Ambulatory Visit: Payer: Self-pay

## 2018-06-15 ENCOUNTER — Ambulatory Visit (INDEPENDENT_AMBULATORY_CARE_PROVIDER_SITE_OTHER): Payer: 59

## 2018-06-15 DIAGNOSIS — L209 Atopic dermatitis, unspecified: Secondary | ICD-10-CM

## 2018-06-15 MED ORDER — DUPILUMAB 300 MG/2ML ~~LOC~~ SOSY
300.0000 mg | PREFILLED_SYRINGE | SUBCUTANEOUS | Status: AC
  Administered 2018-06-15 – 2020-08-12 (×37): 300 mg via SUBCUTANEOUS

## 2018-06-16 ENCOUNTER — Ambulatory Visit: Payer: Self-pay

## 2018-06-29 ENCOUNTER — Ambulatory Visit: Payer: Self-pay

## 2018-06-29 ENCOUNTER — Ambulatory Visit (INDEPENDENT_AMBULATORY_CARE_PROVIDER_SITE_OTHER): Payer: 59

## 2018-06-29 ENCOUNTER — Other Ambulatory Visit: Payer: Self-pay

## 2018-06-29 DIAGNOSIS — L209 Atopic dermatitis, unspecified: Secondary | ICD-10-CM | POA: Diagnosis not present

## 2018-07-12 ENCOUNTER — Ambulatory Visit (INDEPENDENT_AMBULATORY_CARE_PROVIDER_SITE_OTHER): Payer: 59

## 2018-07-12 ENCOUNTER — Other Ambulatory Visit: Payer: Self-pay

## 2018-07-12 DIAGNOSIS — L209 Atopic dermatitis, unspecified: Secondary | ICD-10-CM | POA: Diagnosis not present

## 2018-07-13 ENCOUNTER — Ambulatory Visit: Payer: Self-pay

## 2018-07-26 ENCOUNTER — Other Ambulatory Visit: Payer: Self-pay

## 2018-07-26 ENCOUNTER — Ambulatory Visit (INDEPENDENT_AMBULATORY_CARE_PROVIDER_SITE_OTHER): Payer: 59

## 2018-07-26 DIAGNOSIS — L209 Atopic dermatitis, unspecified: Secondary | ICD-10-CM

## 2018-07-26 NOTE — Progress Notes (Deleted)
   [place] [address] Dept: [phone]  FOLLOW UP NOTE  Patient ID: Levi Simpson, male    DOB: [birth year]  Age: 63 y.o. MRN: [id number] Date of Office Visit: 07/27/2018  Assessment  Chief Complaint: No chief complaint on file.  HPI Levi Simpson is a 63 year old male who presents to the clinic for a follow up visit. He was last seen via televisit on 05/18/2018 by Dr. Nelva Bush for evaluation of atopic dermatitis on Dupixent 300 mg injection every 2 weeks.    Drug Allergies:  No Known Allergies  Physical Exam: There were no vitals taken for this visit.   Physical Exam  Diagnostics:    Assessment and Plan: No diagnosis found.  No orders of the defined types were placed in this encounter.   There are no Patient Instructions on file for this visit.  No follow-ups on file.    Thank you for the opportunity to care for this patient.  Please do not hesitate to contact me with questions.  Gareth Morgan, FNP [facility]

## 2018-07-27 ENCOUNTER — Ambulatory Visit: Payer: Self-pay | Admitting: Family Medicine

## 2018-07-27 ENCOUNTER — Ambulatory Visit: Payer: Medicare Other | Admitting: Allergy

## 2018-08-09 ENCOUNTER — Other Ambulatory Visit: Payer: Self-pay

## 2018-08-09 ENCOUNTER — Ambulatory Visit (INDEPENDENT_AMBULATORY_CARE_PROVIDER_SITE_OTHER): Payer: 59

## 2018-08-09 DIAGNOSIS — L209 Atopic dermatitis, unspecified: Secondary | ICD-10-CM

## 2018-08-23 ENCOUNTER — Ambulatory Visit (INDEPENDENT_AMBULATORY_CARE_PROVIDER_SITE_OTHER): Payer: 59

## 2018-08-23 DIAGNOSIS — L209 Atopic dermatitis, unspecified: Secondary | ICD-10-CM

## 2018-09-06 ENCOUNTER — Ambulatory Visit (INDEPENDENT_AMBULATORY_CARE_PROVIDER_SITE_OTHER): Payer: 59

## 2018-09-06 ENCOUNTER — Other Ambulatory Visit: Payer: Self-pay

## 2018-09-06 DIAGNOSIS — L209 Atopic dermatitis, unspecified: Secondary | ICD-10-CM

## 2018-09-20 ENCOUNTER — Ambulatory Visit (INDEPENDENT_AMBULATORY_CARE_PROVIDER_SITE_OTHER): Payer: 59

## 2018-09-20 ENCOUNTER — Other Ambulatory Visit: Payer: Self-pay

## 2018-09-20 DIAGNOSIS — L209 Atopic dermatitis, unspecified: Secondary | ICD-10-CM | POA: Diagnosis not present

## 2018-10-04 ENCOUNTER — Ambulatory Visit (INDEPENDENT_AMBULATORY_CARE_PROVIDER_SITE_OTHER): Payer: 59

## 2018-10-04 ENCOUNTER — Other Ambulatory Visit: Payer: Self-pay

## 2018-10-04 DIAGNOSIS — L209 Atopic dermatitis, unspecified: Secondary | ICD-10-CM | POA: Diagnosis not present

## 2018-10-18 ENCOUNTER — Other Ambulatory Visit: Payer: Self-pay

## 2018-10-18 ENCOUNTER — Ambulatory Visit (INDEPENDENT_AMBULATORY_CARE_PROVIDER_SITE_OTHER): Payer: 59

## 2018-10-18 DIAGNOSIS — L209 Atopic dermatitis, unspecified: Secondary | ICD-10-CM

## 2018-10-30 ENCOUNTER — Other Ambulatory Visit: Payer: Self-pay | Admitting: Allergy

## 2018-10-30 NOTE — Telephone Encounter (Signed)
Please advise 

## 2018-11-01 ENCOUNTER — Other Ambulatory Visit: Payer: Self-pay

## 2018-11-01 ENCOUNTER — Ambulatory Visit (INDEPENDENT_AMBULATORY_CARE_PROVIDER_SITE_OTHER): Payer: 59

## 2018-11-01 DIAGNOSIS — L209 Atopic dermatitis, unspecified: Secondary | ICD-10-CM

## 2018-11-15 ENCOUNTER — Ambulatory Visit (INDEPENDENT_AMBULATORY_CARE_PROVIDER_SITE_OTHER): Payer: 59

## 2018-11-15 ENCOUNTER — Other Ambulatory Visit: Payer: Self-pay

## 2018-11-15 DIAGNOSIS — L209 Atopic dermatitis, unspecified: Secondary | ICD-10-CM

## 2018-11-29 ENCOUNTER — Ambulatory Visit (INDEPENDENT_AMBULATORY_CARE_PROVIDER_SITE_OTHER): Payer: 59

## 2018-11-29 DIAGNOSIS — L209 Atopic dermatitis, unspecified: Secondary | ICD-10-CM

## 2018-12-13 ENCOUNTER — Ambulatory Visit (INDEPENDENT_AMBULATORY_CARE_PROVIDER_SITE_OTHER): Payer: 59

## 2018-12-13 ENCOUNTER — Other Ambulatory Visit: Payer: Self-pay

## 2018-12-13 DIAGNOSIS — L209 Atopic dermatitis, unspecified: Secondary | ICD-10-CM

## 2018-12-27 ENCOUNTER — Ambulatory Visit (INDEPENDENT_AMBULATORY_CARE_PROVIDER_SITE_OTHER): Payer: 59

## 2018-12-27 ENCOUNTER — Other Ambulatory Visit: Payer: Self-pay

## 2018-12-27 DIAGNOSIS — L209 Atopic dermatitis, unspecified: Secondary | ICD-10-CM

## 2018-12-27 DIAGNOSIS — L2084 Intrinsic (allergic) eczema: Secondary | ICD-10-CM

## 2019-01-10 ENCOUNTER — Ambulatory Visit: Payer: 59

## 2019-01-11 ENCOUNTER — Ambulatory Visit (INDEPENDENT_AMBULATORY_CARE_PROVIDER_SITE_OTHER): Payer: 59

## 2019-01-11 ENCOUNTER — Other Ambulatory Visit: Payer: Self-pay

## 2019-01-11 DIAGNOSIS — L209 Atopic dermatitis, unspecified: Secondary | ICD-10-CM | POA: Diagnosis not present

## 2019-01-24 ENCOUNTER — Ambulatory Visit (INDEPENDENT_AMBULATORY_CARE_PROVIDER_SITE_OTHER): Payer: 59

## 2019-01-24 ENCOUNTER — Other Ambulatory Visit: Payer: Self-pay

## 2019-01-24 DIAGNOSIS — L209 Atopic dermatitis, unspecified: Secondary | ICD-10-CM

## 2019-01-25 ENCOUNTER — Ambulatory Visit: Payer: 59

## 2019-02-07 ENCOUNTER — Ambulatory Visit (INDEPENDENT_AMBULATORY_CARE_PROVIDER_SITE_OTHER): Payer: 59

## 2019-02-07 ENCOUNTER — Other Ambulatory Visit: Payer: Self-pay

## 2019-02-07 DIAGNOSIS — L209 Atopic dermatitis, unspecified: Secondary | ICD-10-CM | POA: Diagnosis not present

## 2019-02-21 ENCOUNTER — Ambulatory Visit: Payer: 59

## 2019-02-23 ENCOUNTER — Other Ambulatory Visit: Payer: Self-pay

## 2019-02-23 ENCOUNTER — Ambulatory Visit (INDEPENDENT_AMBULATORY_CARE_PROVIDER_SITE_OTHER): Payer: 59 | Admitting: *Deleted

## 2019-02-23 DIAGNOSIS — J4540 Moderate persistent asthma, uncomplicated: Secondary | ICD-10-CM

## 2019-03-09 ENCOUNTER — Other Ambulatory Visit: Payer: Self-pay

## 2019-03-09 ENCOUNTER — Ambulatory Visit (INDEPENDENT_AMBULATORY_CARE_PROVIDER_SITE_OTHER): Payer: 59

## 2019-03-09 DIAGNOSIS — L209 Atopic dermatitis, unspecified: Secondary | ICD-10-CM | POA: Diagnosis not present

## 2019-04-06 ENCOUNTER — Ambulatory Visit: Payer: Self-pay

## 2019-04-09 ENCOUNTER — Other Ambulatory Visit: Payer: Self-pay

## 2019-04-09 ENCOUNTER — Ambulatory Visit: Payer: Self-pay

## 2019-04-09 ENCOUNTER — Ambulatory Visit (INDEPENDENT_AMBULATORY_CARE_PROVIDER_SITE_OTHER): Payer: 59

## 2019-04-09 DIAGNOSIS — L209 Atopic dermatitis, unspecified: Secondary | ICD-10-CM | POA: Diagnosis not present

## 2019-04-23 ENCOUNTER — Ambulatory Visit (INDEPENDENT_AMBULATORY_CARE_PROVIDER_SITE_OTHER): Payer: 59

## 2019-04-23 ENCOUNTER — Other Ambulatory Visit: Payer: Self-pay

## 2019-04-23 DIAGNOSIS — L209 Atopic dermatitis, unspecified: Secondary | ICD-10-CM | POA: Diagnosis not present

## 2019-05-07 ENCOUNTER — Ambulatory Visit: Payer: Self-pay

## 2019-05-08 ENCOUNTER — Ambulatory Visit (INDEPENDENT_AMBULATORY_CARE_PROVIDER_SITE_OTHER): Payer: 59

## 2019-05-08 ENCOUNTER — Other Ambulatory Visit: Payer: Self-pay

## 2019-05-08 DIAGNOSIS — L209 Atopic dermatitis, unspecified: Secondary | ICD-10-CM

## 2019-05-11 ENCOUNTER — Emergency Department (HOSPITAL_BASED_OUTPATIENT_CLINIC_OR_DEPARTMENT_OTHER): Payer: 59

## 2019-05-11 ENCOUNTER — Emergency Department (HOSPITAL_BASED_OUTPATIENT_CLINIC_OR_DEPARTMENT_OTHER)
Admission: EM | Admit: 2019-05-11 | Discharge: 2019-05-11 | Disposition: A | Discharge status: Home / Self Care | Payer: 59 | Attending: Emergency Medicine | Admitting: Emergency Medicine

## 2019-05-11 ENCOUNTER — Encounter (HOSPITAL_BASED_OUTPATIENT_CLINIC_OR_DEPARTMENT_OTHER): Payer: Self-pay

## 2019-05-11 ENCOUNTER — Other Ambulatory Visit: Payer: Self-pay

## 2019-05-11 DIAGNOSIS — I10 Essential (primary) hypertension: Secondary | ICD-10-CM | POA: Insufficient documentation

## 2019-05-11 DIAGNOSIS — E119 Type 2 diabetes mellitus without complications: Secondary | ICD-10-CM | POA: Diagnosis not present

## 2019-05-11 DIAGNOSIS — F17210 Nicotine dependence, cigarettes, uncomplicated: Secondary | ICD-10-CM | POA: Diagnosis not present

## 2019-05-11 DIAGNOSIS — M5441 Lumbago with sciatica, right side: Secondary | ICD-10-CM | POA: Insufficient documentation

## 2019-05-11 DIAGNOSIS — M549 Dorsalgia, unspecified: Secondary | ICD-10-CM | POA: Diagnosis present

## 2019-05-11 DIAGNOSIS — G8929 Other chronic pain: Secondary | ICD-10-CM | POA: Insufficient documentation

## 2019-05-11 MED ORDER — KETOROLAC TROMETHAMINE 30 MG/ML IJ SOLN
30.0000 mg | Freq: Once | INTRAMUSCULAR | Status: DC
  Filled 2019-05-11: qty 1

## 2019-05-11 MED ORDER — ACETAMINOPHEN 500 MG PO TABS
1000.0000 mg | ORAL_TABLET | Freq: Once | ORAL | Status: AC
  Administered 2019-05-11: 21:00:00 1000 mg via ORAL
  Filled 2019-05-11: qty 2

## 2019-05-11 MED ORDER — KETOROLAC TROMETHAMINE 30 MG/ML IJ SOLN
30.0000 mg | Freq: Once | INTRAMUSCULAR | Status: AC
  Administered 2019-05-11: 21:00:00 30 mg via INTRAMUSCULAR

## 2019-05-11 MED ORDER — NAPROXEN 500 MG PO TABS: 500.0000 mg | ORAL_TABLET | Freq: Two times a day (BID) | ORAL | 0 refills | Status: AC

## 2019-05-11 MED ORDER — LIDOCAINE 5 % EX PTCH: 1.0000 | MEDICATED_PATCH | CUTANEOUS | 0 refills | Status: AC

## 2019-05-11 NOTE — ED Provider Notes (Signed)
[facility] EMERGENCY DEPARTMENT Provider Note   CSN: [id number] Arrival date & time: 05/11/19  2004    History Chief Complaint  Patient presents with  . Back Pain    Levi Simpson is a 64 y.o. male with past medical history significant for arthritis, diet-controlled diabetes who presents for evaluation of back pain.  Patient states he has had chronic back pain times years after a fall however worse over the last 6 months.  He has been followed by PCP and was supposed be referred to neurosurgery.  Pain radiates down his posterior right buttocks and down his posterior leg and terminates at his posterior knee.  Patient denies any recent falls or injury.  Denies IV drug use, bowel or bladder incontinence, saddle paresthesias.  His pain a 9/10.  Denies additional aggravating or relieving factors.  He is not take anything for his pain at home.  History obtained from patient and past medical record.  No interpreter is used.   HPI     Past Medical History:  Diagnosis Date  . Accidental fall from ladder 06/07/2016    fall from top of 24 ft ladder in tree w/left sided rib fractures 7-12, TPF of L1, L2, L3 and L4 vertebral bodies [facility] Endo 06/07/2016  . Arthritis    "knees, hands, back" (06/08/2016)  . Diabetes mellitus without complication ([place])   . Eczema   . GERD (gastroesophageal reflux disease)   . High cholesterol   . Hypertension   . Type II diabetes mellitus [facility])     Patient Active Problem List   Diagnosis Date Noted  . Multiple fractures of ribs, left side, initial encounter for closed fracture 06/07/2016    Past Surgical History:  Procedure Laterality Date  . APPENDECTOMY         Family History  Problem Relation Age of Onset  . Allergic rhinitis Neg Hx   . Angioedema Neg Hx   . Asthma Neg Hx   . Eczema Neg Hx   . Immunodeficiency Neg Hx   . Urticaria Neg Hx     Social History   Tobacco Use  . Smoking status: Current Some Day Smoker    Packs/day: 0.00   Years: 49.00    Pack years: 0.00    Types: Cigarettes  . Smokeless tobacco: Never Used  Substance Use Topics  . Alcohol use: Yes    Comment: occ  . Drug use: No    Home Medications Prior to Admission medications   Medication Sig Start Date End Date Taking? Authorizing Provider  cyclobenzaprine (FLEXERIL) 10 MG tablet Take 10 mg by mouth at bedtime. 05/08/19   [provider]  [place] 300 MG/2ML prefilled syringe INJECT 300MG  SUBCUTANEOUSLY EVERY OTHER WEEK 10/30/18   Kennith Gain, MD  EPINEPHrine 0.3 mg/0.3 mL IJ SOAJ injection Use as directed for severe allergic reactions 11/17/17   Kennith Gain, MD  fluocinonide ointment (LIDEX) 2.37 % ONE APPLICATION TWO TIMES DAILY FOR SEVERE AREAS. DO NOT USE ON FACE 05/18/18   Kennith Gain, MD  lidocaine (LIDODERM) 5 % Place 1 patch onto the skin daily. Remove & Discard patch within 12 hours or as directed by MD 05/11/19   Rosaria Kubin A, PA-C  naproxen (NAPROSYN) 500 MG tablet Take 1 tablet (500 mg total) by mouth 2 (two) times daily. 05/11/19   Commodore Bellew A, PA-C  triamcinolone cream (KENALOG) 0.1 % ONE APPLICATION TWO TIMES DAILY FOR FOR MILD AREAS 05/18/18   Kennith Gain, MD  Allergies    Patient has no known allergies.  Review of Systems   Review of Systems  Constitutional: Negative.   HENT: Negative.   Respiratory: Negative.   Cardiovascular: Negative.   Gastrointestinal: Negative.   Genitourinary: Negative.   Musculoskeletal: Positive for back pain. Negative for arthralgias, gait problem, joint swelling, myalgias, neck pain and neck stiffness.  Skin: Negative.   Neurological: Negative.   All other systems reviewed and are negative.  Physical Exam Updated Vital Signs BP (!) 146/111 (BP Location: Right Arm)   Pulse 88   Temp 98.1 F (36.7 C) (Oral)   Resp 15   SpO2 98%   Physical Exam  Physical Exam  Constitutional: Pt appears well-developed and well-nourished.  No distress.  HENT:  Head: Normocephalic and atraumatic.  Mouth/Throat: Oropharynx is clear and moist. No oropharyngeal exudate.  Eyes: Conjunctivae are normal.  Neck: Normal range of motion. Neck supple.  Full ROM without pain  Cardiovascular: Normal rate, regular rhythm and intact distal pulses.   Pulmonary/Chest: Effort normal and breath sounds normal. No respiratory distress. Pt has no wheezes.  Abdominal: Soft. Pt exhibits no distension. There is no tenderness, rebound or guarding. No abd bruit or pulsatile mass Musculoskeletal:  Full range of motion of the T-spine and L-spine with flexion, hyperextension, and lateral flexion. No midline tenderness or stepoffs. No tenderness to palpation of the spinous processes of the T-spine or L-spine. Moderate tenderness to palpation of the paraspinous muscles of the L-spine on RIGHT and over the RIGHT piriformis. Positive straight leg raise on right at 40' Lymphadenopathy:    Pt has no cervical adenopathy.  Neurological: Pt is alert. Pt has normal reflexes.  Reflex Scores:      Bicep reflexes are 2+ on the right side and 2+ on the left side.      Brachioradialis reflexes are 2+ on the right side and 2+ on the left side.      Patellar reflexes are 2+ on the right side and 2+ on the left side.      Achilles reflexes are 2+ on the right side and 2+ on the left side. Speech is clear and goal oriented, follows commands Normal 5/5 strength in upper and lower extremities bilaterally including dorsiflexion and plantar flexion, strong and equal grip strength Sensation normal to light and sharp touch Moves extremities without ataxia, coordination intact Normal gait Normal balance No Clonus Skin: Skin is warm and dry. No rash noted or lesions noted. Pt is not diaphoretic. No erythema, ecchymosis,edema or warmth.  Psychiatric: Pt has a normal mood and affect. Behavior is normal.  Nursing note and vitals reviewed. ED Results / Procedures / Treatments     Labs (all labs ordered are listed, but only abnormal results are displayed) Labs Reviewed - No data to display  EKG None  Radiology DG Lumbar Spine Complete  Result Date: 05/11/2019 CLINICAL DATA:  Back pain radiating down right leg for 6 months EXAM: LUMBAR SPINE - COMPLETE 4+ VIEW COMPARISON:  None. FINDINGS: Frontal, bilateral oblique, and lateral views of the lumbar spine are obtained. There are 5 non-rib-bearing lumbar type vertebral bodies in anatomic alignment. Mild diffuse spondylosis most pronounced from L3/L4 through L5/S1, with disc space narrowing and circumferential osteophyte formation. Prominent facet hypertrophy at L4-5 and L5-S1. Sacroiliac joints are normal. IMPRESSION: 1. Lower lumbar spondylosis and facet hypertrophy. 2. No acute fracture. Electronically Signed   By: Sharlet Salina M.D.   On: 05/11/2019 22:07    Procedures Procedures (including critical  care time)  Medications Ordered in ED Medications  acetaminophen (TYLENOL) tablet 1,000 mg (1,000 mg Oral Given 05/11/19 2123)  ketorolac (TORADOL) 30 MG/ML injection 30 mg (30 mg Intramuscular Given 05/11/19 2123)   ED Course  I have reviewed the triage vital signs and the nursing notes.  Pertinent labs & imaging results that were available during my care of the patient were reviewed by me and considered in my medical decision making (see chart for details).  64 year old male presents for evaluation of back pain.  He is afebrile, nonseptic, not ill-appearing.  Has some degree of chronic back pain after fall from 2018 which is worsened over the last 6 months.  Radiates down his posterior right leg.  No recent falls or injuries.  No history of IV drug use, bowel or bladder incontinence, saddle paresthesia or malignancy.  Pain worse with movement.  Has been followed by PCP and is post a follow-up outpatient with neurosurgery however per patient PCP cannot schedule appointment till he has x-ray of his back.  He has not had this  scheduled.  Plan on pain management, plain film x-ray and reevaluate  Plain film x-ray with lumbar spondylosis with facet hypertrophy.  Patient with likely cute on chronic back pain.  Tenderness over his right piriformis.  Positive straight leg raise.  Will treat symptomatically and have him follow-up outpatient with neurosurgery.  Referral placed.  Low suspicion for acute neurosurgical emergency.  Low suspicion for cauda equina, discitis, osteomyelitis, transverse myelitis, psoas abscess  The patient has been appropriately medically screened and/or stabilized in the ED. I have low suspicion for any other emergent medical condition which would require further screening, evaluation or treatment in the ED or require inpatient management.  Patient is hemodynamically stable and in no acute distress.  Patient able to ambulate in department prior to ED.  Evaluation does not show acute pathology that would require ongoing or additional emergent interventions while in the emergency department or further inpatient treatment.  I have discussed the diagnosis with the patient and answered all questions.  Pain is been managed while in the emergency department and patient has no further complaints prior to discharge.  Patient is comfortable with plan discussed in room and is stable for discharge at this time.  I have discussed strict return precautions for returning to the emergency department.  Patient was encouraged to follow-up with PCP/specialist refer to at discharge.    MDM Rules/Calculators/A&P                       Final Clinical Impression(s) / ED Diagnoses Final diagnoses:  Chronic right-sided low back pain with right-sided sciatica    Rx / DC Orders ED Discharge Orders         Ordered    naproxen (NAPROSYN) 500 MG tablet  2 times daily     05/11/19 2221    lidocaine (LIDODERM) 5 %  Every 24 hours     05/11/19 2221           Marvelyn Bouchillon A, PA-C 05/11/19 2230    Melene Plan,  DO 05/11/19 2241

## 2019-05-11 NOTE — ED Triage Notes (Signed)
Pt c/o pain to right lower back that radiates down right LE x 6-7 months-denies recent injury-to triage in w/c

## 2019-05-11 NOTE — Discharge Instructions (Signed)
Take the medication as prescribed. Follow up with Neurosurgery.  Return for new or worsening symptoms

## 2019-05-22 ENCOUNTER — Ambulatory Visit (INDEPENDENT_AMBULATORY_CARE_PROVIDER_SITE_OTHER): Payer: 59

## 2019-05-22 DIAGNOSIS — L209 Atopic dermatitis, unspecified: Secondary | ICD-10-CM | POA: Diagnosis not present

## 2019-06-07 ENCOUNTER — Ambulatory Visit: Payer: Self-pay

## 2019-06-11 ENCOUNTER — Ambulatory Visit (INDEPENDENT_AMBULATORY_CARE_PROVIDER_SITE_OTHER): Payer: 59

## 2019-06-11 ENCOUNTER — Other Ambulatory Visit: Payer: Self-pay

## 2019-06-11 DIAGNOSIS — L209 Atopic dermatitis, unspecified: Secondary | ICD-10-CM

## 2019-06-25 ENCOUNTER — Ambulatory Visit: Payer: Self-pay

## 2019-06-27 ENCOUNTER — Ambulatory Visit (INDEPENDENT_AMBULATORY_CARE_PROVIDER_SITE_OTHER): Payer: 59

## 2019-06-27 ENCOUNTER — Other Ambulatory Visit: Payer: Self-pay

## 2019-06-27 DIAGNOSIS — L209 Atopic dermatitis, unspecified: Secondary | ICD-10-CM | POA: Diagnosis not present

## 2019-07-11 ENCOUNTER — Ambulatory Visit (INDEPENDENT_AMBULATORY_CARE_PROVIDER_SITE_OTHER): Payer: 59

## 2019-07-11 DIAGNOSIS — L209 Atopic dermatitis, unspecified: Secondary | ICD-10-CM

## 2019-07-26 ENCOUNTER — Other Ambulatory Visit: Payer: Self-pay

## 2019-07-26 ENCOUNTER — Ambulatory Visit (INDEPENDENT_AMBULATORY_CARE_PROVIDER_SITE_OTHER): Payer: 59

## 2019-07-26 DIAGNOSIS — L209 Atopic dermatitis, unspecified: Secondary | ICD-10-CM | POA: Diagnosis not present

## 2019-08-09 ENCOUNTER — Ambulatory Visit (INDEPENDENT_AMBULATORY_CARE_PROVIDER_SITE_OTHER): Payer: 59

## 2019-08-09 DIAGNOSIS — L209 Atopic dermatitis, unspecified: Secondary | ICD-10-CM

## 2019-08-23 ENCOUNTER — Telehealth: Payer: Self-pay

## 2019-08-23 ENCOUNTER — Other Ambulatory Visit: Payer: Self-pay

## 2019-08-23 ENCOUNTER — Ambulatory Visit (INDEPENDENT_AMBULATORY_CARE_PROVIDER_SITE_OTHER): Payer: 59

## 2019-08-23 DIAGNOSIS — L209 Atopic dermatitis, unspecified: Secondary | ICD-10-CM | POA: Diagnosis not present

## 2019-08-23 NOTE — Telephone Encounter (Signed)
Error

## 2019-09-03 ENCOUNTER — Ambulatory Visit: Payer: Self-pay

## 2019-09-12 ENCOUNTER — Ambulatory Visit (INDEPENDENT_AMBULATORY_CARE_PROVIDER_SITE_OTHER): Payer: 59

## 2019-09-12 DIAGNOSIS — L209 Atopic dermatitis, unspecified: Secondary | ICD-10-CM | POA: Diagnosis not present

## 2019-09-13 ENCOUNTER — Ambulatory Visit: Payer: Self-pay

## 2019-09-26 ENCOUNTER — Ambulatory Visit (INDEPENDENT_AMBULATORY_CARE_PROVIDER_SITE_OTHER): Payer: 59

## 2019-09-26 ENCOUNTER — Other Ambulatory Visit: Payer: Self-pay

## 2019-09-26 DIAGNOSIS — L209 Atopic dermatitis, unspecified: Secondary | ICD-10-CM | POA: Diagnosis not present

## 2019-10-02 ENCOUNTER — Other Ambulatory Visit: Payer: Self-pay | Admitting: Allergy

## 2019-10-03 NOTE — Telephone Encounter (Signed)
Please advise 

## 2019-10-05 HISTORY — PX: TOTAL HIP ARTHROPLASTY: SHX124

## 2019-10-10 ENCOUNTER — Ambulatory Visit: Payer: Self-pay

## 2019-10-12 ENCOUNTER — Ambulatory Visit (INDEPENDENT_AMBULATORY_CARE_PROVIDER_SITE_OTHER): Payer: 59 | Admitting: *Deleted

## 2019-10-12 DIAGNOSIS — L209 Atopic dermatitis, unspecified: Secondary | ICD-10-CM | POA: Diagnosis not present

## 2019-10-26 ENCOUNTER — Ambulatory Visit: Payer: Self-pay

## 2019-11-13 ENCOUNTER — Other Ambulatory Visit: Payer: Self-pay

## 2019-11-13 ENCOUNTER — Ambulatory Visit (INDEPENDENT_AMBULATORY_CARE_PROVIDER_SITE_OTHER): Payer: 59 | Admitting: *Deleted

## 2019-11-13 DIAGNOSIS — L209 Atopic dermatitis, unspecified: Secondary | ICD-10-CM | POA: Diagnosis not present

## 2019-11-27 ENCOUNTER — Ambulatory Visit: Payer: Self-pay

## 2019-12-06 ENCOUNTER — Ambulatory Visit (INDEPENDENT_AMBULATORY_CARE_PROVIDER_SITE_OTHER): Payer: 59 | Admitting: *Deleted

## 2019-12-06 ENCOUNTER — Other Ambulatory Visit: Payer: Self-pay

## 2019-12-06 DIAGNOSIS — L209 Atopic dermatitis, unspecified: Secondary | ICD-10-CM | POA: Diagnosis not present

## 2019-12-17 ENCOUNTER — Telehealth: Payer: Self-pay | Admitting: *Deleted

## 2019-12-17 NOTE — Telephone Encounter (Signed)
Tried to call patient to advise Dupixent approval ends 12/31 and needs MD appt for re-approval but unable to leave message no voicemail set up

## 2019-12-20 ENCOUNTER — Ambulatory Visit: Payer: Self-pay

## 2020-04-30 ENCOUNTER — Ambulatory Visit (INDEPENDENT_AMBULATORY_CARE_PROVIDER_SITE_OTHER): Payer: 59 | Admitting: *Deleted

## 2020-04-30 ENCOUNTER — Other Ambulatory Visit: Payer: Self-pay

## 2020-04-30 DIAGNOSIS — L209 Atopic dermatitis, unspecified: Secondary | ICD-10-CM | POA: Diagnosis not present

## 2020-05-08 NOTE — Patient Instructions (Addendum)
Severe atopic dermatitis Continue daily moisturizing program Continue Dupixent injections.  We will send a message to Tammy, our Biologics coordinator, to let her know that your interested in continuing Dupixent injections.  She will be getting in touch with you. Schedule a follow-up with dermatology to follow-up on the vitiligo Re-start triamcinolone 0.1% cream using 1 application twice a day as needed to red itchy areas.  Do not use this on face, neck, groin, or armpit region Start Desonide 0.05% ointment using 1 application twice a day as needed to red itchy areas on the face and neck and less severe areas.  Please let us know if this treatment plan is not working well for you Schedule a follow-up appointment in 2 months with Dr. Delorse Lek

## 2020-05-09 ENCOUNTER — Encounter: Payer: Self-pay | Admitting: Family

## 2020-05-09 ENCOUNTER — Other Ambulatory Visit: Payer: Self-pay

## 2020-05-09 ENCOUNTER — Ambulatory Visit (INDEPENDENT_AMBULATORY_CARE_PROVIDER_SITE_OTHER): Payer: 59 | Admitting: Family

## 2020-05-09 VITALS — BP 102/62 | HR 86 | Temp 98.7°F | Resp 16 | Ht 68.0 in | Wt 159.0 lb

## 2020-05-09 DIAGNOSIS — L209 Atopic dermatitis, unspecified: Secondary | ICD-10-CM | POA: Diagnosis not present

## 2020-05-09 MED ORDER — DESONIDE 0.05 % EX OINT: 1.0000 "application " | TOPICAL_OINTMENT | Freq: Two times a day (BID) | CUTANEOUS | 3 refills | Status: AC | PRN

## 2020-05-09 MED ORDER — TRIAMCINOLONE ACETONIDE 0.1 % EX CREA: TOPICAL_CREAM | CUTANEOUS | 3 refills | Status: AC

## 2020-05-09 NOTE — Progress Notes (Addendum)
[address] [place] [address] Dept: [phone]  FOLLOW UP NOTE  Patient ID: Levi Simpson, male    DOB: 1955-08-22  Age: 65 y.o. MRN: [id number] Date of Office Visit: 05/09/2020  Assessment  Chief Complaint: Eczema  HPI Levi Simpson is a 65 year old male who presents today for follow-up of severe eczema.  He was last seen on May 18, 2018 by Dr. Delorse Lek.  Severe eczema is reported as moderately controlled with Dupixent injections every 2 weeks.  His last Dupixent injection was on April 30, 2020.  He is interested in continuing Dupixent injections.  He reports that he did not get Dupixent injections for period of time due to having right total hip surgery in October 2021.  He does feel that his Dupixent injections have helped his eczema.  He is currently out of Lidex and triamcinolone.  He is just been using an over-the-counter lotion that he does not know the name of.  Recommended that he apply sunscreen when he goes outdoors.  He reports he has not been using sunscreen.  Also, he mentions that he was diagnosed with vitiligo.  He has not seen dermatology since 2020.  Drug Allergies:  No Known Allergies  Review of Systems: Review of Systems  Constitutional: Negative for chills and fever.  HENT:       Denies rhinorrhea, nasal congestion, and postnasal drip  Eyes:       Denies itchy watery eyes  Respiratory: Negative for cough, shortness of breath and wheezing.   Cardiovascular: Negative for chest pain and palpitations.  Gastrointestinal:       Reports reflux at times.  He reports that he takes the medication but the name of the medication is unknown  Genitourinary: Negative for dysuria.  Skin: Positive for itching.       Reports itching at times due to eczema  Neurological: Negative for headaches.  Endo/Heme/Allergies: Negative for environmental allergies.    Physical Exam: BP 102/62   Pulse 86   Temp 98.7 F (37.1 C) (Temporal)   Resp 16   Ht 5\' 8"  (1.727 m)    Wt 159 lb (72.1 kg)   SpO2 96%   BMI 24.18 kg/m    Physical Exam Constitutional:      Appearance: Normal appearance.  HENT:     Head: Normocephalic and atraumatic.     Comments: Pharynx normal, eyes normal, ears normal, nose normal    Right Ear: Tympanic membrane, ear canal and external ear normal.     Left Ear: Tympanic membrane, ear canal and external ear normal.     Nose: Nose normal.     Mouth/Throat:     Mouth: Mucous membranes are moist.     Pharynx: Oropharynx is clear.  Eyes:     Conjunctiva/sclera: Conjunctivae normal.  Cardiovascular:     Rate and Rhythm: Regular rhythm.     Heart sounds: Normal heart sounds.  Pulmonary:     Effort: Pulmonary effort is normal.     Breath sounds: Normal breath sounds.     Comments: Lungs clear to auscultation Musculoskeletal:     Cervical back: Neck supple.  Skin:    General: Skin is warm.     Comments: Hypopigmentation noted on bilateral hands, head, back of his neck, and the right side of his lower back.  Neurological:     Mental Status: He is alert and oriented to person, place, and time.  Psychiatric:        Mood and Affect: Mood  normal.        Behavior: Behavior normal.        Thought Content: Thought content normal.        Judgment: Judgment normal.     Diagnostics: None  Assessment and Plan: 1. Atopic dermatitis, unspecified type     Meds ordered this encounter  Medications   triamcinolone cream (KENALOG) 0.1 %    Sig: Use 1 application sparingly twice a day as needed to red itchy areas below face or neck.  Do not use on face, neck, groin, or armpit region    Dispense:  45 g    Refill:  3   desonide (DESOWEN) 0.05 % ointment    Sig: Apply 1 application topically 2 (two) times daily as needed (to red itchy areas on the face or neck).    Dispense:  60 g    Refill:  3    Patient Instructions  Severe atopic dermatitis Continue daily moisturizing program Continue Dupixent injections.  We will send a message  to Tammy, our Biologics coordinator, to let her know that your interested in continuing Dupixent injections.  She will be getting in touch with you. Schedule a follow-up with dermatology to follow-up on the vitiligo Re-start triamcinolone 0.1% cream using 1 application twice a day as needed to red itchy areas.  Do not use this on face, neck, groin, or armpit region Start Desonide 0.05% ointment using 1 application twice a day as needed to red itchy areas on the face and neck and less severe areas.  Please let us know if this treatment plan is not working well for you Schedule a follow-up appointment in 2 months with Dr. Delorse Lek   Return in about 2 months (around 07/09/2020), or if symptoms worsen or fail to improve.    Thank you for the opportunity to care for this patient.  Please do not hesitate to contact me with questions.  Levi Settle, FNP [facility]  I have provided oversight concerning evaluation and treatment of this patient's health issues addressed during today's encounter. I agree with the assessment and therapeutic plan as outlined in the note.   Signed,   Jessica Priest, MD,  Allergy and Immunology,  [facility] [place].

## 2020-05-14 ENCOUNTER — Telehealth: Payer: Self-pay | Admitting: *Deleted

## 2020-05-14 ENCOUNTER — Other Ambulatory Visit: Payer: Self-pay

## 2020-05-14 ENCOUNTER — Ambulatory Visit (INDEPENDENT_AMBULATORY_CARE_PROVIDER_SITE_OTHER): Payer: 59

## 2020-05-14 DIAGNOSIS — L209 Atopic dermatitis, unspecified: Secondary | ICD-10-CM

## 2020-05-14 NOTE — Telephone Encounter (Signed)
Advised patient of approval and number given to reorder his Dupixent

## 2020-05-14 NOTE — Telephone Encounter (Signed)
-----   Message from Nehemiah Settle, FNP sent at 05/09/2020  4:40 PM EDT ----- Levi Simpson would like to continue Dupixent injections.

## 2020-05-28 ENCOUNTER — Ambulatory Visit: Payer: Self-pay

## 2020-05-30 ENCOUNTER — Ambulatory Visit (INDEPENDENT_AMBULATORY_CARE_PROVIDER_SITE_OTHER): Payer: 59 | Admitting: *Deleted

## 2020-05-30 ENCOUNTER — Other Ambulatory Visit: Payer: Self-pay

## 2020-05-30 DIAGNOSIS — L209 Atopic dermatitis, unspecified: Secondary | ICD-10-CM

## 2020-06-13 ENCOUNTER — Ambulatory Visit: Payer: Self-pay

## 2020-06-16 ENCOUNTER — Ambulatory Visit: Payer: Self-pay

## 2020-07-15 ENCOUNTER — Ambulatory Visit: Payer: 59 | Admitting: Family

## 2020-07-30 ENCOUNTER — Ambulatory Visit (INDEPENDENT_AMBULATORY_CARE_PROVIDER_SITE_OTHER): Payer: 59

## 2020-07-30 ENCOUNTER — Other Ambulatory Visit: Payer: Self-pay

## 2020-07-30 DIAGNOSIS — L209 Atopic dermatitis, unspecified: Secondary | ICD-10-CM

## 2020-08-12 ENCOUNTER — Other Ambulatory Visit: Payer: Self-pay

## 2020-08-12 ENCOUNTER — Ambulatory Visit (INDEPENDENT_AMBULATORY_CARE_PROVIDER_SITE_OTHER): Payer: 59

## 2020-08-12 DIAGNOSIS — L209 Atopic dermatitis, unspecified: Secondary | ICD-10-CM | POA: Diagnosis not present

## 2020-08-20 ENCOUNTER — Ambulatory Visit: Payer: 59 | Admitting: Allergy

## 2020-08-26 ENCOUNTER — Ambulatory Visit: Payer: 59

## 2021-04-03 ENCOUNTER — Telehealth (HOSPITAL_BASED_OUTPATIENT_CLINIC_OR_DEPARTMENT_OTHER): Payer: Self-pay

## 2021-04-03 ENCOUNTER — Other Ambulatory Visit (HOSPITAL_BASED_OUTPATIENT_CLINIC_OR_DEPARTMENT_OTHER): Payer: Self-pay | Admitting: Registered Nurse

## 2021-04-03 DIAGNOSIS — R109 Unspecified abdominal pain: Secondary | ICD-10-CM

## 2021-04-03 DIAGNOSIS — R10814 Left lower quadrant abdominal tenderness: Secondary | ICD-10-CM

## 2022-02-11 IMAGING — CR DG LUMBAR SPINE COMPLETE 4+V
5 series · 5 of 5 positions shown · non-contrast
Comparison: None.

CLINICAL DATA: Back pain radiating down right leg for 6 months

EXAM:
LUMBAR SPINE - COMPLETE 4+ VIEW

[t l-spine a.p.]
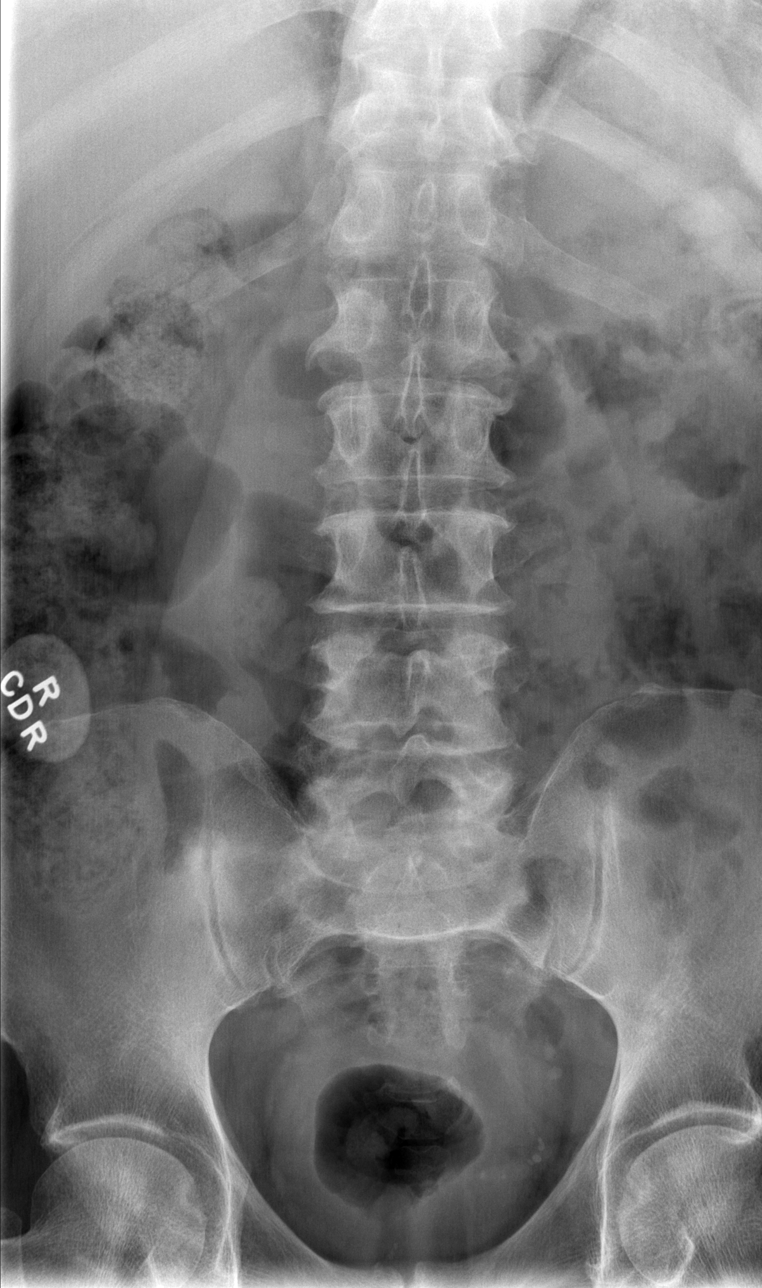

[t l-spine oblique exposure (1 of 2)]
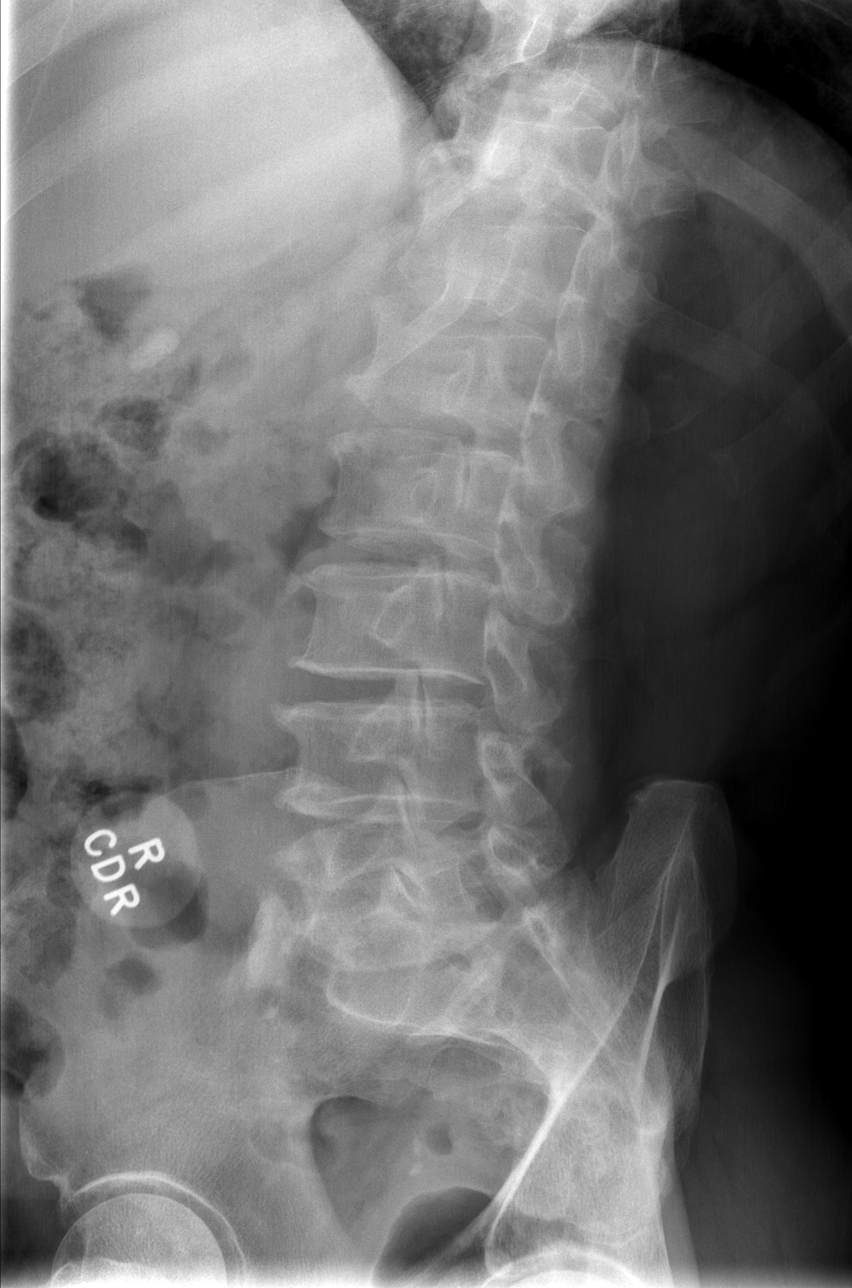

[t l-spine oblique exposure (2 of 2)]
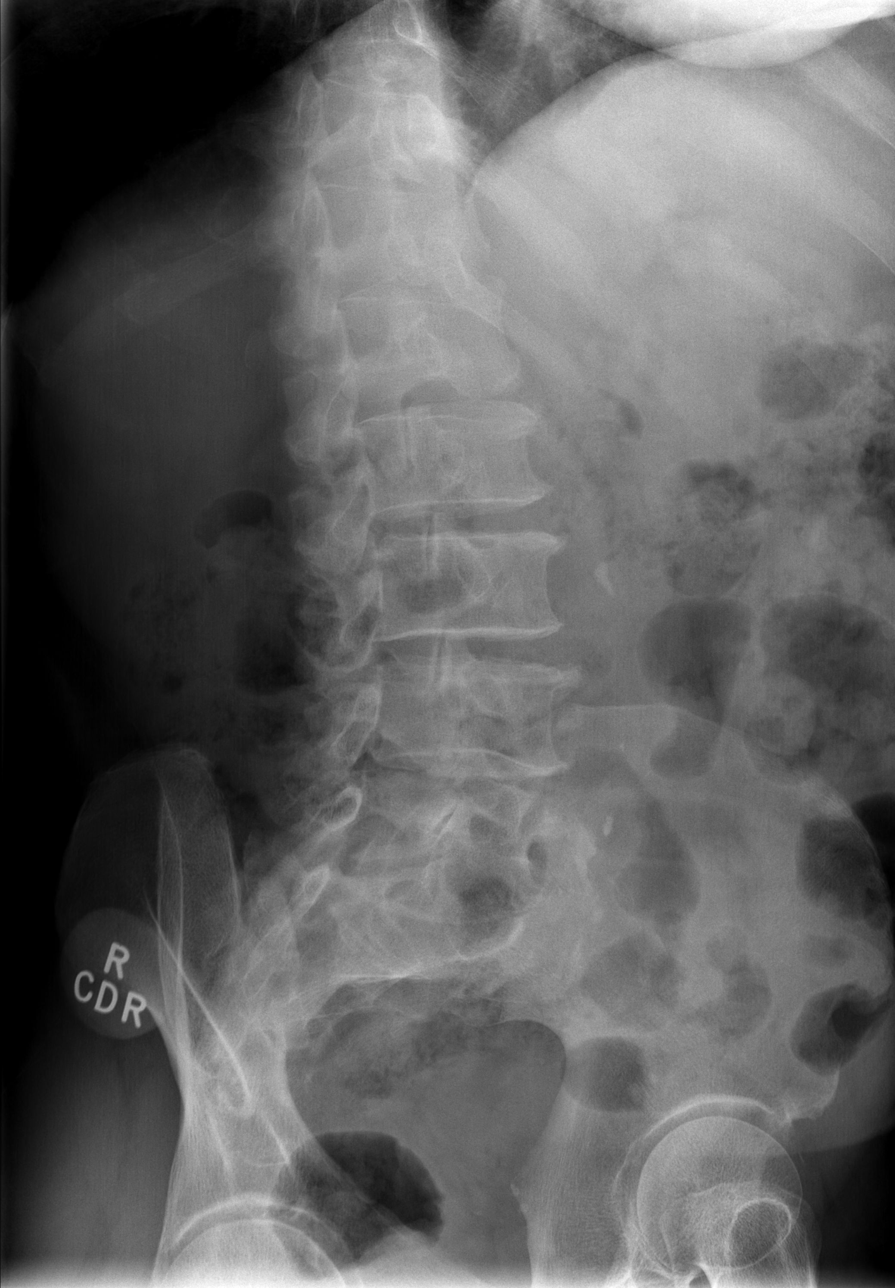

[t l-spine lat]
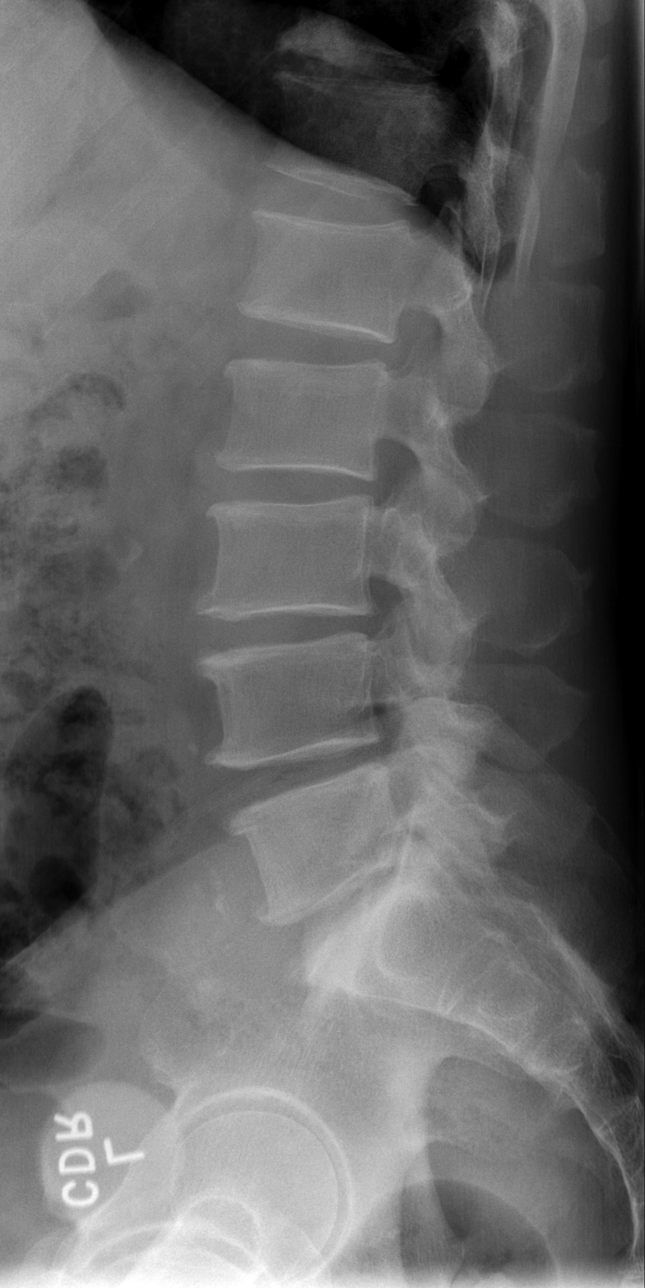

[t l-spine l5-s1 spot]
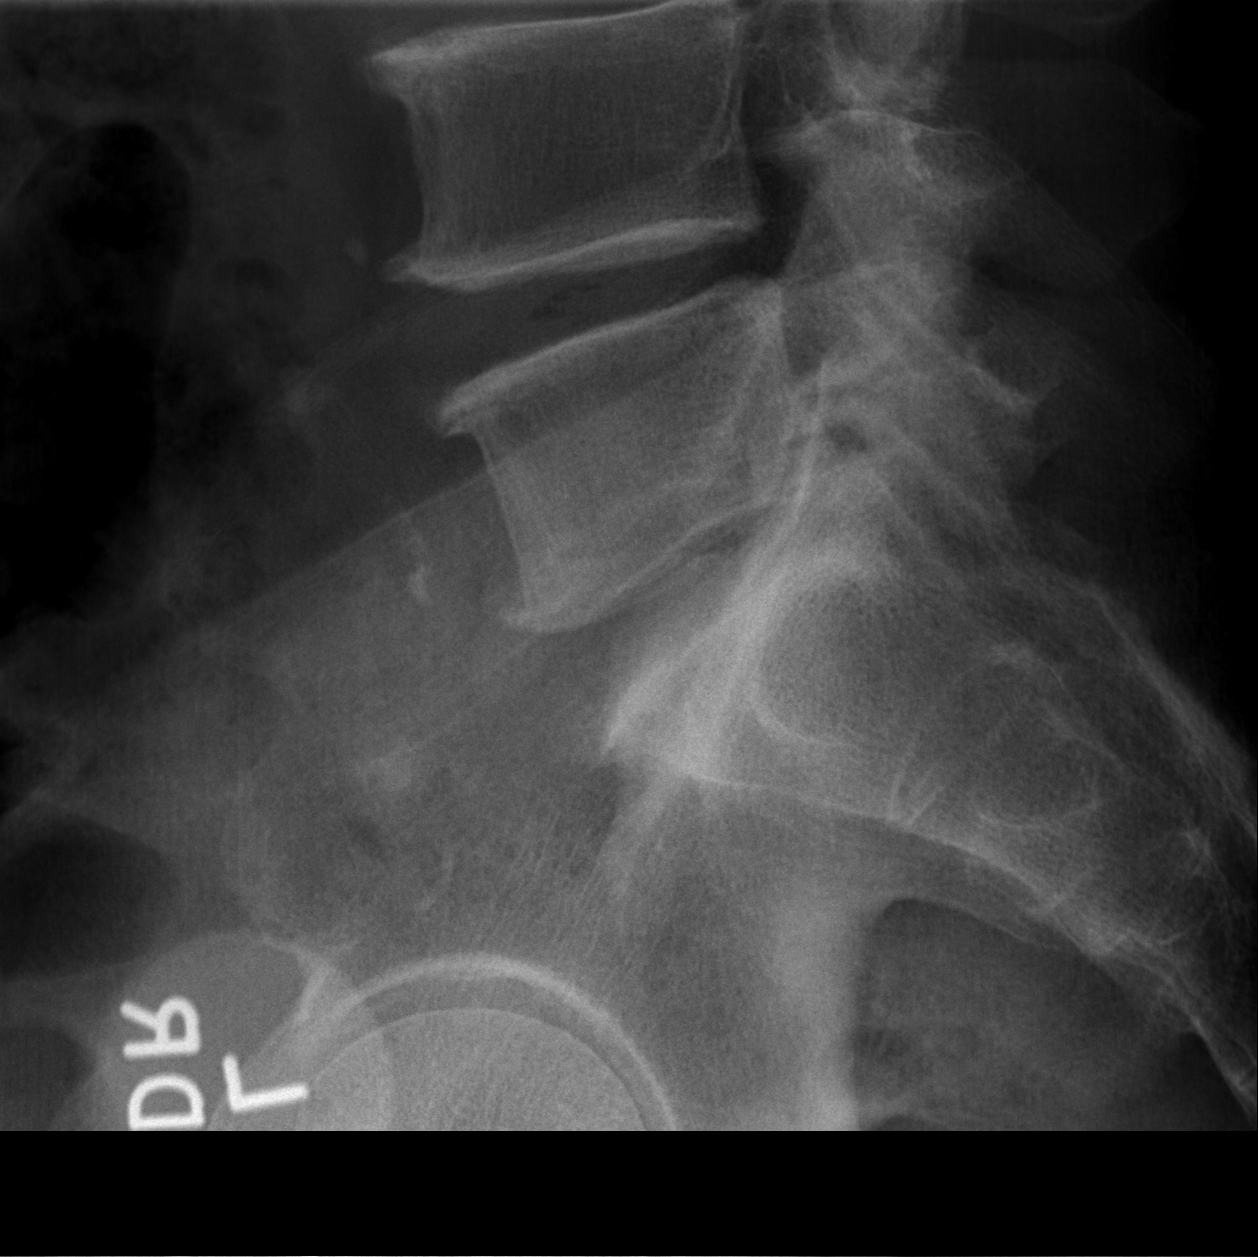

[5 of 5 positions shown; findings below may reference images not displayed]

FINDINGS: Frontal, bilateral oblique, and lateral views of the lumbar spine
are obtained. There are 5 non-rib-bearing lumbar type vertebral
bodies in anatomic alignment. Mild diffuse spondylosis most
pronounced from L3/L4 through L5/S1, with disc space narrowing and
circumferential osteophyte formation. Prominent facet hypertrophy at
L4-5 and L5-S1. Sacroiliac joints are normal.
IMPRESSION: 1. Lower lumbar spondylosis and facet hypertrophy.
2. No acute fracture.
